# Patient Record
Sex: Male | Born: 2000 | Hispanic: No | Marital: Single | Smoking: Never smoker
Health system: Southern US, Community
[De-identification: ages and names within clinical notes are randomized; demographics above are authoritative.]

## PROBLEM LIST (undated history)

## (undated) DIAGNOSIS — F909 Attention-deficit hyperactivity disorder, unspecified type: Secondary | ICD-10-CM

## (undated) HISTORY — DX: Attention-deficit hyperactivity disorder, unspecified type: F90.9

---
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
## 2019-01-07 ENCOUNTER — Telehealth: Payer: Self-pay | Admitting: Family Medicine

## 2019-01-07 NOTE — Telephone Encounter (Signed)
Copied from [place] [id number]. Topic: Quick Communication - Rx Refill/Question >> Jan 07, 2019  1:36 PM Yvette Rack wrote: Pt mother stated the Rx for Vyvanse was sent to the wrong pharmacy and she was told it could not be transferred.  Medication: amphetamine-dextroamphetamine (ADDERALL) 10 MG tablet  and  lisdexamfetamine (VYVANSE) 40 MG capsule  Has the patient contacted their pharmacy? yes   Preferred Pharmacy (with phone number or street name): [id number] - [address], [place] [phone] (Phone)  [phone] (Fax)  Agent: Please be advised that RX refills may take up to 3 business days. We ask that you follow-up with your pharmacy.

## 2019-01-08 MED ORDER — AMPHETAMINE-DEXTROAMPHETAMINE 10 MG PO TABS: 10.0000 mg | ORAL_TABLET | Freq: Every day | ORAL | 0 refills | Status: DC | PRN

## 2019-01-08 NOTE — Telephone Encounter (Signed)
Ad[facility]all refilled to [facility], [facility] and cancel the vyvanse they have on file and find out if he's filled for this month yet. Once we get that sorted I will send over the remainder to bridge him the entire 3 months

## 2019-01-09 MED ORDER — LISDEXAMFETAMINE DIMESYLATE 40 MG PO CAPS: 40.0000 mg | ORAL_CAPSULE | ORAL | 0 refills | Status: DC

## 2019-01-09 NOTE — Telephone Encounter (Signed)
Patient notified

## 2019-01-09 NOTE — Telephone Encounter (Signed)
Called patient's mother. Left detailed VM  (DPR revived) and asked to return call with any questions.

## 2019-01-09 NOTE — Telephone Encounter (Signed)
Please let her know both the adderall refill and the remaining vyvanse (set for next month) have been transferred

## 2019-01-09 NOTE — Telephone Encounter (Signed)
Spoke with Pharmacy. Vyvanse last picked up on 01/07/19. Remaining RXs cancelled.

## 2019-01-09 NOTE — Telephone Encounter (Signed)
[facility]yvanse sent to [facility]

## 2019-03-02 ENCOUNTER — Telehealth: Payer: Self-pay | Admitting: Family Medicine

## 2019-03-02 MED ORDER — LISDEXAMFETAMINE DIMESYLATE 40 MG PO CAPS: 40.0000 mg | ORAL_CAPSULE | ORAL | 0 refills | Status: DC

## 2019-03-02 NOTE — Telephone Encounter (Signed)
Pt.notified

## 2019-03-02 NOTE — Telephone Encounter (Signed)
Routing to provider. Next appointment scheduled for 03/11/19.

## 2019-03-02 NOTE — Telephone Encounter (Signed)
Medication Refill - Medication: lisdexamfetamine (VYVANSE) 40 MG capsule   Please call Pt's mom if an appt is needed /she is not sure when he needs his next appt   Has the patient contacted their pharmacy? No. (Agent: If no, request that the patient contact the pharmacy for the refill.) (Agent: If yes, when and what did the pharmacy advise?)  Preferred Pharmacy (with phone number or street name):  [id number] [address], [place] - [place] [phone] (Phone) [phone] (Fax)     Agent: Please be advised that RX refills may take up to 3 business days. We ask that you follow-up with your pharmacy.

## 2019-03-02 NOTE — Telephone Encounter (Signed)
Looks like it was just scheduled a week or so past when he's due which is no problem. I will go ahead and get it sent over to drop on the day it will be due which is 03/06/19

## 2019-03-11 ENCOUNTER — Encounter: Payer: Self-pay | Admitting: Family Medicine

## 2019-03-11 ENCOUNTER — Other Ambulatory Visit: Payer: Self-pay

## 2019-03-11 ENCOUNTER — Ambulatory Visit (INDEPENDENT_AMBULATORY_CARE_PROVIDER_SITE_OTHER): Admitting: Family Medicine

## 2019-03-11 VITALS — Ht 70.0 in | Wt 145.0 lb

## 2019-03-11 DIAGNOSIS — F909 Attention-deficit hyperactivity disorder, unspecified type: Secondary | ICD-10-CM | POA: Diagnosis not present

## 2019-03-11 MED ORDER — LISDEXAMFETAMINE DIMESYLATE 50 MG PO CAPS: 50.0000 mg | ORAL_CAPSULE | Freq: Every day | ORAL | 0 refills | Status: DC

## 2019-03-11 NOTE — Progress Notes (Signed)
Ht 5\' 10"  (1.778 m)   Wt 145 lb (65.8 kg)   BMI 20.81 kg/m    Subjective:    Patient ID: Robert CoveyMatthew Bagshaw, male    DOB: January 18, 2001, 18 y.o.   MRN: [id number]  HPI: Robert Lane is a 18 y.o. male  Chief Complaint  Patient presents with  . ADHD  . Medication Management    . This visit was completed via WebEx due to the restrictions of the COVID-19 pandemic. All issues as above were discussed and addressed. Physical exam was done as above through visual confirmation on WebEx. If it was felt that the patient should be evaluated in the office, they were directed there. The patient verbally consented to this visit. . Location of the patient: home . Location of the provider: work . Those involved with this call:  . Provider: Roosvelt Maserachel Kalynn Declercq, PA-C . CMA: Wilhemena DurieBrittany Russell, CMA . Front Desk/Registration: Harriet PhoJoliza Johnson  . Time spent on call: 15 minutes with patient face to face via video conference. More than 50% of this time was spent in counseling and coordination of care. 5 minutes total spent in review of patient's record and preparation of their chart. I verified patient identity using two factors (patient name and date of birth). Patient consents verbally to being seen via telemedicine visit today.   Patient here today for ADHD f/u. Currently taking 40 mg vyvanse daily. Has additional 10 mg short release adderall available but states he's not really taking that. Feels his vyvanse works "ok", but wondering if an increased dose may give him more ability to complete and accomplish more than he is now. Denies any side effects to the medication. No CP, SOB, palpitations, sleep or appetite issues.   Relevant past medical, surgical, family and social history reviewed and updated as indicated. Interim medical history since our last visit reviewed. Allergies and medications reviewed and updated.  Review of Systems  Per HPI unless specifically indicated above     Objective:    Ht 5\' 10"  (1.778  m)   Wt 145 lb (65.8 kg)   BMI 20.81 kg/m   Wt Readings from Last 3 Encounters:  03/11/19 145 lb (65.8 kg) (38 %, Z= -0.30)*  10/29/18 145 lb 1 oz (65.8 kg) (41 %, Z= -0.23)*  10/26/18 145 lb (65.8 kg) (41 %, Z= -0.23)*   * Growth percentiles are based on CDC (Boys, 2-20 Years) data.    Physical Exam Vitals signs and nursing note reviewed.  Constitutional:      General: He is not in acute distress.    Appearance: Normal appearance.  HENT:     Head: Atraumatic.     Right Ear: External ear normal.     Left Ear: External ear normal.     Nose: Nose normal. No congestion.     Mouth/Throat:     Mouth: Mucous membranes are moist.     Pharynx: Oropharynx is clear.  Eyes:     Extraocular Movements: Extraocular movements intact.     Conjunctiva/sclera: Conjunctivae normal.  Neck:     Musculoskeletal: Normal range of motion.  Pulmonary:     Effort: Pulmonary effort is normal. No respiratory distress.  Musculoskeletal: Normal range of motion.  Skin:    General: Skin is dry.     Findings: No erythema or rash.  Neurological:     Mental Status: He is oriented to person, place, and time.  Psychiatric:        Mood and Affect: Mood normal.  Thought Content: Thought content normal.        Judgment: Judgment normal.     No results found for this or any previous visit.    Assessment & Plan:   Problem List Items Addressed This Visit      Other   ADHD - Primary    Will trial increased vyvanse at 50 mg daily prn. Patient states he's no longer taking the short release adderall prn but would like to keep on list in case needing it in the future. F/u in 1 month for recheck          Follow up plan: Return in about 4 weeks (around 04/08/2019) for ADHD f/u.

## 2019-03-16 NOTE — Assessment & Plan Note (Signed)
Will trial increased vyvanse at 50 mg daily prn. Patient states he's no longer taking the short release adderall prn but would like to keep on list in case needing it in the future. F/u in 1 month for recheck

## 2019-04-16 ENCOUNTER — Other Ambulatory Visit: Payer: Self-pay | Admitting: Family Medicine

## 2019-04-16 NOTE — Telephone Encounter (Signed)
Requested medication (s) are due for refill today: yes  Requested medication (s) are on the active medication list: yes  Last refill:  01/21/2019  Future visit scheduled: no  Notes to clinic:  Review for refill Ordering provider and pcp are different   Requested Prescriptions  Pending Prescriptions Disp Refills   cloNIDine (CATAPRES) 0.2 MG tablet [Pharmacy Med Name: CLONIDINE 0.2MG  TABLETS] 90 tablet 0    Sig: TAKE 1 TABLET BY MOUTH EVERY DAY     Cardiovascular:  Alpha-2 Agonists Passed - 04/16/2019 11:02 AM      Passed - Last BP in normal range    BP Readings from Last 1 Encounters:  11/02/18 99/64         Passed - Last Heart Rate in normal range    Pulse Readings from Last 1 Encounters:  11/02/18 70         Passed - Valid encounter within last 6 months    Recent Outpatient Visits          1 month ago Attention deficit hyperactivity disorder (ADHD), unspecified ADHD type   [facility] Volney American, PA-C   4 months ago Attention deficit hyperactivity disorder (ADHD), unspecified ADHD type   [facility] Volney American, [place]   8 months ago Attention deficit hyperactivity disorder (ADHD), unspecified ADHD type   [place], [place], [place]   11 months ago Attention deficit hyperactivity disorder (ADHD), unspecified ADHD type   [facility] Volney American, [place]   1 year ago Attention deficit hyperactivity disorder (ADHD), unspecified ADHD type   [place], [place], [place]

## 2019-05-08 ENCOUNTER — Other Ambulatory Visit: Payer: Self-pay

## 2019-05-08 MED ORDER — LISDEXAMFETAMINE DIMESYLATE 50 MG PO CAPS: 50.0000 mg | ORAL_CAPSULE | Freq: Every day | ORAL | 0 refills | Status: DC

## 2019-05-08 NOTE — Telephone Encounter (Signed)
Called and left patient's mother a detailed message (signed DPR) letting her know what Apolonio Schneiders said. Asked for them to please call back to schedule a f/up ASAP.

## 2019-05-08 NOTE — Telephone Encounter (Signed)
Copied from [place] [id number]. Topic: Quick Communication - See Telephone Encounter >> May 07, 2019  6:43 PM Loma Boston wrote: CRM for notification. See Telephone encounter for: 05/07/19. Please call pt mother Rip Harbour) about why the son's scripts are not being filled. She states she has been calling for a week. [phone] amphetamine-dextroamphetamine (ADDERALL) 10 MG tablet   lisdexamfetamine (VYVANSE) 50 MG capsule   Routing to provider. Patient was last seen 03/11/19.

## 2019-05-08 NOTE — Telephone Encounter (Signed)
His vyvanse was increased back in August during that visit and he was to f/u in 1 month for recheck. He also told me he keeps the adderall around but does not use it and did not need a refill. Do not have any record of recent refill requests for these medications so I'm not sure what she is referring to with that comment. He needs an appt for further refills as he is a month overdue for this. I will refill to bridge him with the vyvanse. Can discuss the adderall if he needs that at this OV

## 2019-05-20 ENCOUNTER — Ambulatory Visit (INDEPENDENT_AMBULATORY_CARE_PROVIDER_SITE_OTHER): Admitting: Family Medicine

## 2019-05-20 ENCOUNTER — Encounter: Payer: Self-pay | Admitting: Family Medicine

## 2019-05-20 ENCOUNTER — Other Ambulatory Visit: Payer: Self-pay

## 2019-05-20 DIAGNOSIS — F909 Attention-deficit hyperactivity disorder, unspecified type: Secondary | ICD-10-CM

## 2019-05-20 MED ORDER — LISDEXAMFETAMINE DIMESYLATE 40 MG PO CAPS: 40.0000 mg | ORAL_CAPSULE | ORAL | 0 refills | Status: DC

## 2019-05-20 NOTE — Assessment & Plan Note (Signed)
Will cut back to 40 mg and monitor for improvement. Also suggested going down to 30 mg vyvanse and trying strattera as the stimulant may be too strong for him at this time. Will check back in 1 month, he is agreeable to going back to previous dose. He does not take the adderall short release, has not since getting out of school this summer.

## 2019-05-20 NOTE — Progress Notes (Signed)
   There were no vitals taken for this visit.   Subjective:    Patient ID: Robert Lane, male    DOB: 2001/02/04, 18 y.o.   MRN: [id number]  HPI: Robert Lane is a 19 y.o. male  Chief Complaint  Patient presents with  . ADHD    . This visit was completed via telephone due to the restrictions of the COVID-19 pandemic. All issues as above were discussed and addressed. Physical exam was done as above through visual confirmation on telephone. If it was felt that the patient should be evaluated in the office, they were directed there. The patient verbally consented to this visit. . Location of the patient: home . Location of the provider: work . Those involved with this call:  . Provider: Merrie Roof, PA-C . CMA: Yvonna Alanis, [place] . Front Desk/Registration: Jill Side  . Time spent on call: 15 minutes on the phone discussing health concerns. 5 minutes total spent in review of patient's record and preparation of their chart. I verified patient identity using two factors (patient name and date of birth). Patient consents verbally to being seen via telemedicine visit today.   Following up on his ADHD. Feels the increase in his vyvanse to 50 mg works as far as focus and productivity but has been having blank thoughts, loss of creativity and doesn't like how his brain feels. Notes he will forget to eat sometimes nearly all day. Wanting to cut back down at least to his previous dose. Denies CP, palpitations, SOB, HAs, dizziness. Does not take the short release adderall.   Relevant past medical, surgical, family and social history reviewed and updated as indicated. Interim medical history since our last visit reviewed. Allergies and medications reviewed and updated.  Review of Systems  Per HPI unless specifically indicated above     Objective:    There were no vitals taken for this visit.  Wt Readings from Last 3 Encounters:  03/11/19 145 lb (65.8 kg) (38 %, Z= -0.30)*  10/29/18  145 lb 1 oz (65.8 kg) (41 %, Z= -0.23)*  10/26/18 145 lb (65.8 kg) (41 %, Z= -0.23)*   * Growth percentiles are based on CDC (Boys, 2-20 Years) data.    Physical Exam  Unable to perform PE due to patient lack of access to video technology for today's visit  No results found for this or any previous visit.    Assessment & Plan:   Problem List Items Addressed This Visit      Other   ADHD - Primary    Will cut back to 40 mg and monitor for improvement. Also suggested going down to 30 mg vyvanse and trying strattera as the stimulant may be too strong for him at this time. Will check back in 1 month, he is agreeable to going back to previous dose. He does not take the adderall short release, has not since getting out of school this summer.           Follow up plan: Return in about 4 weeks (around 06/17/2019) for ADHD f/u.

## 2019-06-17 ENCOUNTER — Other Ambulatory Visit: Payer: Self-pay | Admitting: Family Medicine

## 2019-06-17 ENCOUNTER — Telehealth: Payer: Self-pay | Admitting: Family Medicine

## 2019-06-17 NOTE — Telephone Encounter (Signed)
Medication Refill - Vyvance 40 mg Has the patient contacted their pharmacy? No. (Agent: If no, request that the patient contact the pharmacy for the refill.) (Agent: If yes, when and what did the pharmacy advise?)  Preferred Pharmacy (with phone number or street name): [facility] in [place]: Please be advised that RX refills may take up to 3 business days. We ask that you follow-up with your pharmacy.

## 2019-06-18 MED ORDER — LISDEXAMFETAMINE DIMESYLATE 40 MG PO CAPS: 40.0000 mg | ORAL_CAPSULE | ORAL | 0 refills | Status: DC

## 2019-06-18 NOTE — Telephone Encounter (Signed)
Routing to provider  

## 2019-07-08 ENCOUNTER — Other Ambulatory Visit: Payer: Self-pay

## 2019-07-08 ENCOUNTER — Encounter: Payer: Self-pay | Admitting: Family Medicine

## 2019-07-08 ENCOUNTER — Ambulatory Visit (INDEPENDENT_AMBULATORY_CARE_PROVIDER_SITE_OTHER): Admitting: Family Medicine

## 2019-07-08 VITALS — Ht 70.0 in | Wt 145.0 lb

## 2019-07-08 DIAGNOSIS — F909 Attention-deficit hyperactivity disorder, unspecified type: Secondary | ICD-10-CM | POA: Diagnosis not present

## 2019-07-08 DIAGNOSIS — G4700 Insomnia, unspecified: Secondary | ICD-10-CM

## 2019-07-08 MED ORDER — LISDEXAMFETAMINE DIMESYLATE 40 MG PO CAPS: 40.0000 mg | ORAL_CAPSULE | ORAL | 0 refills | Status: DC

## 2019-07-08 MED ORDER — CLONIDINE HCL 0.2 MG PO TABS: 0.2000 mg | ORAL_TABLET | Freq: Every day | ORAL | 1 refills | Status: DC

## 2019-07-08 NOTE — Assessment & Plan Note (Signed)
Improved on 40 mg vyvanse, will continue current regimen for now and revisit further decrease at upcoming f/u

## 2019-07-08 NOTE — Assessment & Plan Note (Signed)
Stable and under good control, continue current regimen 

## 2019-07-08 NOTE — Progress Notes (Signed)
Ht 5\' 10"  (1.778 m)   Wt 145 lb (65.8 kg)   BMI 20.81 kg/m    Subjective:    Patient ID: Robert Lane, male    DOB: 2000-09-19, 18 y.o.   MRN: [id number]  HPI: Robert Lane is a 18 y.o. male  Chief Complaint  Patient presents with  . ADHD    . This visit was completed via WebEx due to the restrictions of the COVID-19 pandemic. All issues as above were discussed and addressed. Physical exam was done as above through visual confirmation on WebEx. If it was felt that the patient should be evaluated in the office, they were directed there. The patient verbally consented to this visit. . Location of the patient: work . Location of the provider: work . Those involved with this call:  . Provider: Merrie Roof, PA-C . CMA: Lesle Chris, [place] . Front Desk/Registration: Jill Side  . Time spent on call: 15 minutes with patient face to face via video conference. More than 50% of this time was spent in counseling and coordination of care. 5 minutes total spent in review of patient's record and preparation of their chart. I verified patient identity using two factors (patient name and date of birth). Patient consents verbally to being seen via telemedicine visit today.   Here today for f/u ADHD after decreasing vyvanse dose. Feeling better on the 40 mg dose, has better appetite and does not feel like a zombie anymore. Able to get tasks done at work and stay on track. Denies CP, SOB, HAs, dizziness. No longer taking the short release adderall since being out of school but has it in case needed.   Still taking the clonidine for sleep nightly which continues to work well for him.   Relevant past medical, surgical, family and social history reviewed and updated as indicated. Interim medical history since our last visit reviewed. Allergies and medications reviewed and updated.  Review of Systems  Per HPI unless specifically indicated above     Objective:    Ht 5\' 10"  (1.778 m)   Wt 145  lb (65.8 kg)   BMI 20.81 kg/m   Wt Readings from Last 3 Encounters:  07/08/19 145 lb (65.8 kg) (36 %, Z= -0.35)*  03/11/19 145 lb (65.8 kg) (38 %, Z= -0.30)*  10/29/18 145 lb 1 oz (65.8 kg) (41 %, Z= -0.23)*   * Growth percentiles are based on CDC (Boys, 2-20 Years) data.    Physical Exam Vitals signs and nursing note reviewed.  Constitutional:      General: He is not in acute distress.    Appearance: Normal appearance.  HENT:     Head: Atraumatic.     Right Ear: External ear normal.     Left Ear: External ear normal.     Nose: Nose normal. No congestion.     Mouth/Throat:     Mouth: Mucous membranes are moist.     Pharynx: Oropharynx is clear.  Eyes:     Extraocular Movements: Extraocular movements intact.     Conjunctiva/sclera: Conjunctivae normal.  Neck:     Musculoskeletal: Normal range of motion.  Pulmonary:     Effort: Pulmonary effort is normal. No respiratory distress.  Musculoskeletal: Normal range of motion.  Skin:    General: Skin is dry.     Findings: No erythema or rash.  Neurological:     Mental Status: He is oriented to person, place, and time.  Psychiatric:  Mood and Affect: Mood normal.        Thought Content: Thought content normal.        Judgment: Judgment normal.     No results found for this or any previous visit.    Assessment & Plan:   Problem List Items Addressed This Visit      Other   ADHD    Improved on 40 mg vyvanse, will continue current regimen for now and revisit further decrease at upcoming f/u      Insomnia - Primary    Stable and under good control, continue current regimen          Follow up plan: Return in about 3 months (around 10/06/2019) for ADHD f/u.

## 2019-07-27 ENCOUNTER — Telehealth: Payer: Self-pay | Admitting: Family Medicine

## 2019-07-27 ENCOUNTER — Encounter: Payer: Self-pay | Admitting: Family Medicine

## 2019-07-27 NOTE — Telephone Encounter (Signed)
Called pt to schedule 3 month f/u per Apolonio Schneiders, no answer, left vm, sending letter.

## 2019-08-17 ENCOUNTER — Telehealth: Payer: Self-pay | Admitting: Family Medicine

## 2019-08-17 NOTE — Telephone Encounter (Signed)
Called pharmacy, they are getting medication ready for the patient.   Called mother and let her know that they could pick up RX shortly.

## 2019-08-17 NOTE — Telephone Encounter (Signed)
Pt's, mother is here (DPR) is here stating pt is out of medication of his ADHD medication and wants the medication sent to [facility] in [place] . 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

## 2019-08-17 NOTE — Telephone Encounter (Signed)
Can we call to get filled a day early? If not, please cancel the one for tomorrow and I can re-send to fill today

## 2019-09-02 DIAGNOSIS — Z7689 Persons encountering health services in other specified circumstances: Secondary | ICD-10-CM | POA: Diagnosis not present

## 2019-09-15 ENCOUNTER — Telehealth: Payer: Self-pay | Admitting: Family Medicine

## 2019-09-15 ENCOUNTER — Other Ambulatory Visit: Payer: Self-pay | Admitting: Family Medicine

## 2019-09-15 MED ORDER — LISDEXAMFETAMINE DIMESYLATE 40 MG PO CAPS: 40.0000 mg | ORAL_CAPSULE | ORAL | 0 refills | Status: DC

## 2019-09-15 NOTE — Telephone Encounter (Signed)
Please call pharmacy and cancel the vyvanse that is set for 2/19, I am re-sending one to be filled today  **I had done this several days ago and now see it never routed - not sure if patient was already contacted**

## 2019-09-17 NOTE — Telephone Encounter (Signed)
Prescription cancelled for 02/19. Tried calling patient, no answer. LVM to return phone call.

## 2019-09-18 NOTE — Telephone Encounter (Signed)
LVM for pt that Rx was sent to pharmacy.

## 2019-10-08 ENCOUNTER — Encounter: Payer: Self-pay | Admitting: Family Medicine

## 2019-10-08 ENCOUNTER — Ambulatory Visit: Admitting: Family Medicine

## 2019-10-08 ENCOUNTER — Telehealth (INDEPENDENT_AMBULATORY_CARE_PROVIDER_SITE_OTHER): Admitting: Family Medicine

## 2019-10-08 VITALS — Ht 70.0 in | Wt 145.0 lb

## 2019-10-08 DIAGNOSIS — G4700 Insomnia, unspecified: Secondary | ICD-10-CM

## 2019-10-08 DIAGNOSIS — F909 Attention-deficit hyperactivity disorder, unspecified type: Secondary | ICD-10-CM | POA: Diagnosis not present

## 2019-10-08 NOTE — Progress Notes (Signed)
   Ht 5\' 10"  (1.778 m)   Wt 145 lb (65.8 kg)   BMI 20.81 kg/m    Subjective:    Patient ID: Robert Lane, male    DOB: 2000-09-10, 19 y.o.   MRN: [id number]  HPI: Robert Lane is a 19 y.o. male  Chief Complaint  Patient presents with  . ADHD    . This visit was completed via telephone due to the restrictions of the COVID-19 pandemic. All issues as above were discussed and addressed. Physical exam was done as above through visual confirmation on telephone. If it was felt that the patient should be evaluated in the office, they were directed there. The patient verbally consented to this visit. . Location of the patient: home . Location of the provider: work . Those involved with this call:  . Provider: 12, PA-C . CMA: Roosvelt Maser, CMA . Front Desk/Registration: Elton Sin  . Time spent on call: 15 minutes on the phone discussing health concerns. 5 minutes total spent in review of patient's record and preparation of their chart. I verified patient identity using two factors (patient name and date of birth). Patient consents verbally to being seen via telemedicine visit today.   Presenting today for f/u ADHD and insomnia. Feeling well on current regimen. Taking 40 mg vyvanse with good control. Denies side effects. Sleeping well with clonidine at bedtime. No new concerns.   Relevant past medical, surgical, family and social history reviewed and updated as indicated. Interim medical history since our last visit reviewed. Allergies and medications reviewed and updated.  Review of Systems  Per HPI unless specifically indicated above     Objective:    Ht 5\' 10"  (1.778 m)   Wt 145 lb (65.8 kg)   BMI 20.81 kg/m   Wt Readings from Last 3 Encounters:  10/08/19 145 lb (65.8 kg) (35 %, Z= -0.39)*  07/08/19 145 lb (65.8 kg) (36 %, Z= -0.35)*  03/11/19 145 lb (65.8 kg) (38 %, Z= -0.30)*   * Growth percentiles are based on CDC (Boys, 2-20 Years) data.    Physical  Exam  Unable to perform PE due to technical difficulties with video technology  No results found for this or any previous visit.    Assessment & Plan:   Problem List Items Addressed This Visit      Other   ADHD - Primary    Stable and well controlled. Not taking the short release adderall anymore since he's graduated from high school. Continue vyvanse regimen      Insomnia    Stable and well controlled, continue current regimen          Follow up plan: Return in about 3 months (around 01/08/2020) for ADHD f/u.

## 2019-10-13 ENCOUNTER — Encounter: Payer: Self-pay | Admitting: Family Medicine

## 2019-10-13 ENCOUNTER — Telehealth: Payer: Self-pay | Admitting: Family Medicine

## 2019-10-13 MED ORDER — LISDEXAMFETAMINE DIMESYLATE 40 MG PO CAPS: 40.0000 mg | ORAL_CAPSULE | ORAL | 0 refills | Status: DC

## 2019-10-13 MED ORDER — CLONIDINE HCL 0.2 MG PO TABS: 0.2000 mg | ORAL_TABLET | Freq: Every day | ORAL | 1 refills | Status: DC

## 2019-10-13 NOTE — Telephone Encounter (Signed)
-----   Message from Particia Nearing, [place] sent at 10/13/2019  8:01 AM EDT ----- 3 month ADHD f/u

## 2019-10-13 NOTE — Assessment & Plan Note (Signed)
Stable and well controlled, continue current regimen 

## 2019-10-13 NOTE — Assessment & Plan Note (Signed)
Stable and well controlled. Not taking the short release adderall anymore since he's graduated from high school. Continue vyvanse regimen

## 2019-10-13 NOTE — Telephone Encounter (Signed)
lvm to make this 3 month f/u, sent letter

## 2020-01-14 ENCOUNTER — Ambulatory Visit: Admitting: Family Medicine

## 2020-01-21 ENCOUNTER — Telehealth (INDEPENDENT_AMBULATORY_CARE_PROVIDER_SITE_OTHER): Admitting: Family Medicine

## 2020-01-21 ENCOUNTER — Encounter: Payer: Self-pay | Admitting: Family Medicine

## 2020-01-21 DIAGNOSIS — F909 Attention-deficit hyperactivity disorder, unspecified type: Secondary | ICD-10-CM

## 2020-01-21 MED ORDER — LISDEXAMFETAMINE DIMESYLATE 40 MG PO CAPS: 40.0000 mg | ORAL_CAPSULE | ORAL | 0 refills | Status: DC

## 2020-01-21 NOTE — Progress Notes (Signed)
There were no vitals taken for this visit.   Subjective:    Patient ID: Robert Lane, male    DOB: [birth year], 19 y.o.   MRN: [id number]  HPI: Robert Lane is a 19 y.o. male  Chief Complaint  Patient presents with  . ADHD    . This visit was completed via MyChart due to the restrictions of the COVID-19 pandemic. All issues as above were discussed and addressed. Physical exam was done as above through visual confirmation on MyChart. If it was felt that the patient should be evaluated in the office, they were directed there. The patient verbally consented to this visit. . Location of the patient: home . Location of the provider: work . Those involved with this call:  . Provider: Merrie Roof, PA-C . CMA: Lesle Chris, [place] . Front Desk/Registration: Don Perking  . Time spent on call: 10 minutes with patient face to face via video conference. More than 50% of this time was spent in counseling and coordination of care. 5 minutes total spent in review of patient's record and preparation of their chart. I verified patient identity using two factors (patient name and date of birth). Patient consents verbally to being seen via telemedicine visit today.   Here today for 3 month ADHD f/u. Taking 40 mg vyvanse daily which seems to be helping quite a bit with his focus and organization. Denies side effects including CP, SOB, palpitations, sleep or appetite concerns.   Relevant past medical, surgical, family and social history reviewed and updated as indicated. Interim medical history since our last visit reviewed. Allergies and medications reviewed and updated.  Review of Systems  Per HPI unless specifically indicated above     Objective:    There were no vitals taken for this visit.  Wt Readings from Last 3 Encounters:  10/08/19 145 lb (65.8 kg) (35 %, Z= -0.39)*  07/08/19 145 lb (65.8 kg) (36 %, Z= -0.35)*  03/11/19 145 lb (65.8 kg) (38 %, Z= -0.30)*   * Growth percentiles  are based on CDC (Boys, 2-20 Years) data.    Physical Exam Vitals and nursing note reviewed.  Constitutional:      General: He is not in acute distress.    Appearance: Normal appearance.  HENT:     Head: Atraumatic.     Right Ear: External ear normal.     Left Ear: External ear normal.     Nose: Nose normal. No congestion.     Mouth/Throat:     Mouth: Mucous membranes are moist.     Pharynx: Oropharynx is clear.  Eyes:     Extraocular Movements: Extraocular movements intact.     Conjunctiva/sclera: Conjunctivae normal.  Cardiovascular:     Rate and Rhythm: Normal rate and regular rhythm.  Pulmonary:     Effort: Pulmonary effort is normal. No respiratory distress.  Musculoskeletal:        General: Normal range of motion.     Cervical back: Normal range of motion.  Skin:    General: Skin is dry.     Findings: No erythema or rash.  Neurological:     Mental Status: He is oriented to person, place, and time.  Psychiatric:        Mood and Affect: Mood normal.        Thought Content: Thought content normal.        Judgment: Judgment normal.     No results found for this or any previous visit.  Assessment & Plan:   Problem List Items Addressed This Visit      Other   ADHD - Primary    Stable and well controlled, continue current regimen          Follow up plan: Return in about 3 months (around 04/22/2020) for ADHD.

## 2020-01-24 NOTE — Assessment & Plan Note (Signed)
Stable and well controlled, continue current regimen 

## 2020-01-25 ENCOUNTER — Telehealth: Payer: Self-pay | Admitting: Family Medicine

## 2020-01-25 NOTE — Telephone Encounter (Signed)
Unable to lvm for covid screening

## 2020-01-25 NOTE — Telephone Encounter (Signed)
Error unable to lvm to make this apt.

## 2020-01-25 NOTE — Telephone Encounter (Signed)
-----   Message from Particia Nearing, [place] sent at 01/24/2020  7:40 AM EDT ----- 3 month ADHD

## 2020-01-28 NOTE — Telephone Encounter (Signed)
Lvm to make this apt. 

## 2020-02-02 ENCOUNTER — Encounter: Payer: Self-pay | Admitting: Family Medicine

## 2020-02-02 NOTE — Telephone Encounter (Signed)
Unable to lvm to make this apt. Sent letter.  °

## 2020-04-24 ENCOUNTER — Other Ambulatory Visit: Payer: Self-pay

## 2020-04-24 ENCOUNTER — Ambulatory Visit
Admission: EM | Admit: 2020-04-24 | Discharge: 2020-04-24 | Disposition: A | Discharge status: Home / Self Care | Payer: Worker's Compensation | Attending: Emergency Medicine | Admitting: Emergency Medicine

## 2020-04-24 DIAGNOSIS — S91331A Puncture wound without foreign body, right foot, initial encounter: Secondary | ICD-10-CM | POA: Diagnosis not present

## 2020-04-24 DIAGNOSIS — Z23 Encounter for immunization: Secondary | ICD-10-CM

## 2020-04-24 MED ORDER — TETANUS-DIPHTH-ACELL PERTUSSIS 5-2.5-18.5 LF-MCG/0.5 IM SUSP
0.5000 mL | Freq: Once | INTRAMUSCULAR | Status: AC
  Administered 2020-04-24: 0.5 mL via INTRAMUSCULAR

## 2020-04-24 MED ORDER — CIPROFLOXACIN HCL 500 MG PO TABS: 500.0000 mg | ORAL_TABLET | Freq: Two times a day (BID) | ORAL | 0 refills | Status: AC

## 2020-04-24 NOTE — ED Provider Notes (Signed)
[facility]    CSN: [id number] Arrival date & time: 04/24/20  1355      History   Chief Complaint Chief Complaint  Patient presents with   Puncture Wound    HPI Robert Lane is a 19 y.o. male.   19 yo male here for evaluation of a puncture wound to the bottom of his foot from a nail in a pallet at the [facility] distribution center.   He denies numbness or tingling. He was wearing tennis shoes.  Tetanus updated at triage.      Past Medical History:  Diagnosis Date   ADHD     Patient Active Problem List   Diagnosis Date Noted   Insomnia 05/13/2018   ADHD 04/19/2017    History reviewed. No pertinent surgical history.     Home Medications    Prior to Admission medications   Medication Sig Start Date End Date Taking? Authorizing Provider  cloNIDine (CATAPRES) 0.2 MG tablet Take 1 tablet (0.2 mg total) by mouth daily. 10/13/19  Yes Particia Nearing, PA-C  lisdexamfetamine (VYVANSE) 40 MG capsule Take 1 capsule (40 mg total) by mouth every morning. 01/21/20  Yes Particia Nearing, PA-C  lisdexamfetamine (VYVANSE) 40 MG capsule Take 1 capsule (40 mg total) by mouth every morning. 02/20/20  Yes Particia Nearing, PA-C  lisdexamfetamine (VYVANSE) 40 MG capsule Take 1 capsule (40 mg total) by mouth every morning. 03/21/20  Yes Particia Nearing, PA-C  ciprofloxacin (CIPRO) 500 MG tablet Take 1 tablet (500 mg total) by mouth every 12 (twelve) hours for 10 days. 04/24/20 05/04/20  Becky Augusta, NP    Family History Family History  Problem Relation Age of Onset   Diabetes Mother    Hypertension Maternal Grandmother    Heart disease Maternal Grandfather    Hypertension Maternal Grandfather    Cancer Neg Hx     Social History Social History   Tobacco Use   Smoking status: Never Smoker   Smokeless tobacco: Never Used  Building services engineer Use: Never used  Substance Use Topics   Alcohol use: No   Drug use: No      Allergies   Patient has no known allergies.   Review of Systems Review of Systems  Constitutional: Negative for activity change and fever.  HENT: Negative for congestion and rhinorrhea.   Respiratory: Negative for cough and shortness of breath.   Cardiovascular: Negative for chest pain.  Gastrointestinal: Negative for diarrhea, nausea and vomiting.  Musculoskeletal: Positive for myalgias. Negative for arthralgias.  Skin: Positive for wound.       Puncture wound in right foot.   Neurological: Negative for weakness, numbness and headaches.  Hematological: Negative.   Psychiatric/Behavioral: Negative.      Physical Exam Triage Vital Signs ED Triage Vitals  Enc Vitals Group     BP 04/24/20 1525 108/68     Pulse Rate 04/24/20 1525 76     Resp 04/24/20 1525 18     Temp 04/24/20 1525 97.8 F (36.6 C)     Temp Source 04/24/20 1525 Oral     SpO2 04/24/20 1525 100 %     Weight 04/24/20 1524 145 lb (65.8 kg)     Height 04/24/20 1524 5' 9.5" (1.765 m)     Head Circumference --      Peak Flow --      Pain Score 04/24/20 1524 0     Pain Loc --      Pain Edu? --  Excl. in GC? --    No data found.  Updated Vital Signs BP 108/68 (BP Location: Right Arm)    Pulse 76    Temp 97.8 F (36.6 C) (Oral)    Resp 18    Ht 5' 9.5" (1.765 m)    Wt 145 lb (65.8 kg)    SpO2 100%    BMI 21.11 kg/m   Visual Acuity Right Eye Distance:   Left Eye Distance:   Bilateral Distance:    Right Eye Near:   Left Eye Near:    Bilateral Near:     Physical Exam Vitals and nursing note reviewed.  Constitutional:      Appearance: Normal appearance.  HENT:     Head: Normocephalic and atraumatic.     Nose: Nose normal.  Eyes:     Extraocular Movements: Extraocular movements intact.     Conjunctiva/sclera: Conjunctivae normal.     Pupils: Pupils are equal, round, and reactive to light.  Cardiovascular:     Rate and Rhythm: Normal rate and regular rhythm.     Pulses: Normal pulses.           Dorsalis pedis pulses are 2+ on the right side.       Posterior tibial pulses are 2+ on the right side.     Heart sounds: Normal heart sounds.  Pulmonary:     Effort: Pulmonary effort is normal.     Breath sounds: Normal breath sounds.  Musculoskeletal:        General: Signs of injury present. Normal range of motion.     Cervical back: Normal range of motion.     Right foot: Normal range of motion. No deformity, foot drop or prominent metatarsal heads.       Feet:  Feet:     Right foot:     Skin integrity: No erythema or callus.     Comments: There is a single puncture wound to the sole of the right foot in the pad behind the 5th toe. There is no bleeding and no ecchymosis. No tenderness to palpation.  Patient soaked his foot in water and chlorhexidine.  Skin:    General: Skin is warm and dry.     Capillary Refill: Capillary refill takes less than 2 seconds.  Neurological:     General: No focal deficit present.     Mental Status: He is alert and oriented to person, place, and time.  Psychiatric:        Mood and Affect: Mood normal.        Behavior: Behavior normal.        Thought Content: Thought content normal.        Judgment: Judgment normal.      UC Treatments / Results  Labs (all labs ordered are listed, but only abnormal results are displayed) Labs Reviewed - No data to display  EKG   Radiology No results found.  Procedures Procedures (including critical care time)  Medications Ordered in UC Medications  Tdap (BOOSTRIX) injection 0.5 mL (0.5 mLs Intramuscular Given 04/24/20 1526)    Initial Impression / Assessment and Plan / UC Course  I have reviewed the triage vital signs and the nursing notes.  Pertinent labs & imaging results that were available during my care of the patient were reviewed by me and considered in my medical decision making (see chart for details).   Patient is here for evaluation of a puncture wound in his right foot after stepping  on a  nail in a pallet at the [employer] distribution center. HE was wearing tennis shoes and it went through the sole.   The wound is clean and there is no redness, ecchymosis, bleeding, or drainage from the wound. No numbness or tingling and patient has full ROM.  Tetanus has been updated. Will cover with Cipro due to nail penetrating dirty sneakers to prevent infection, have patient keep his foot elevated as much as possible, and avoid wearing shoes for the next week.   Final Clinical Impressions(s) / UC Diagnoses   Final diagnoses:  Puncture wound of right foot, initial encounter     Discharge Instructions     Keep the wound clean and dry.  If you can avoid wearing shoes for the next week please do so.  Keep your right foot elevated as much as possible.  Take the Cipro twice daily for 10 days to prevent infection.  Return for redness, pain, swelling, red streaks going up your foot, drainage, or fever.     ED Prescriptions    Medication Sig Dispense Auth. Provider   ciprofloxacin (CIPRO) 500 MG tablet Take 1 tablet (500 mg total) by mouth every 12 (twelve) hours for 10 days. 20 tablet Becky Augusta, NP     PDMP not reviewed this encounter.   Becky Augusta, NP 04/24/20 1623

## 2020-04-24 NOTE — Discharge Instructions (Signed)
Keep the wound clean and dry.  If you can avoid wearing shoes for the next week please do so.  Keep your right foot elevated as much as possible.  Take the Cipro twice daily for 10 days to prevent infection.  Return for redness, pain, swelling, red streaks going up your foot, drainage, or fever.

## 2020-04-24 NOTE — ED Triage Notes (Signed)
Patient states that he stepped on a nail that was on a pallet and the nail went through his shoe in to his right foot. Patient states that this occurred at work and he works at the [employer] center.

## 2020-04-25 ENCOUNTER — Telehealth: Payer: Self-pay | Admitting: Family Medicine

## 2020-04-25 NOTE — Telephone Encounter (Signed)
Yes, let me know when he's scheduled for and I'll get him enough to make it to his appt

## 2020-04-25 NOTE — Telephone Encounter (Signed)
Can a short supply of medication be sent in to get him to next appointment? Will route back to admin for appointment if this can be done.

## 2020-04-25 NOTE — Telephone Encounter (Signed)
Copied from CRM [id number]. Topic: General - Inquiry >> Apr 25, 2020  4:09 PM Robert Lane wrote: Reason for CRM: Patient called and needs a medication refill on Vyvanse and was told he needed a visit with a new provider since his previous provider is no longer there. The only appointment available was on Thursday but he wanted something tomorrow because he will be completely out of medication, he took his last pill today. He is requesting a call back at [phone]. Please advise

## 2020-04-26 MED ORDER — LISDEXAMFETAMINE DIMESYLATE 40 MG PO CAPS: 40.0000 mg | ORAL_CAPSULE | ORAL | 0 refills | Status: DC

## 2020-04-26 NOTE — Telephone Encounter (Signed)
Called pt schedule he is scheduled for 10/21

## 2020-05-19 ENCOUNTER — Telehealth (INDEPENDENT_AMBULATORY_CARE_PROVIDER_SITE_OTHER): Admitting: Family Medicine

## 2020-05-19 ENCOUNTER — Encounter: Payer: Self-pay | Admitting: Family Medicine

## 2020-05-19 DIAGNOSIS — F909 Attention-deficit hyperactivity disorder, unspecified type: Secondary | ICD-10-CM

## 2020-05-19 MED ORDER — CLONIDINE HCL 0.2 MG PO TABS
0.2000 mg | ORAL_TABLET | Freq: Every day | ORAL | 1 refills | Status: DC
Start: 2020-05-19 — End: 2021-02-22

## 2020-05-19 MED ORDER — LISDEXAMFETAMINE DIMESYLATE 40 MG PO CAPS: 40.0000 mg | ORAL_CAPSULE | ORAL | 0 refills | Status: DC

## 2020-05-19 NOTE — Assessment & Plan Note (Signed)
Under good control on current regimen. Continue current regimen. Continue to monitor. Call with any concerns. Refills given for 3 months. Follow up 3 months.    

## 2020-05-19 NOTE — Progress Notes (Signed)
Ht 5' 9.5" (1.765 m)   Wt 150 lb (68 kg)   BMI 21.83 kg/m    Subjective:    Patient ID: Robert Lane, male    DOB: 27-Jun-2001, 19 y.o.   MRN: [id number]  HPI: Robert Lane is a 19 y.o. male  Chief Complaint  Patient presents with  . ADHD   ADHD FOLLOW UP ADHD status: controlled Satisfied with current therapy: yes Medication compliance:  excellent compliance Controlled substance contract: yes Previous psychiatry evaluation: no Previous medications: yes  Taking meds on weekends/vacations: yes Work/school performance:  good Difficulty sustaining attention/completing tasks: yes Distracted by extraneous stimuli: no Does not listen when spoken to: no  Fidgets with hands or feet: no Unable to stay in seat: no Blurts out/interrupts others: no ADHD Medication Side Effects: no    Decreased appetite: no    Headache: no    Sleeping disturbance pattern: no    Irritability: no    Rebound effects (worse than baseline) off medication: no    Anxiousness: no    Dizziness: no    Tics: no  Relevant past medical, surgical, family and social history reviewed and updated as indicated. Interim medical history since our last visit reviewed. Allergies and medications reviewed and updated.  Review of Systems  Constitutional: Negative.   Respiratory: Negative.   Cardiovascular: Negative.   Gastrointestinal: Negative.   Musculoskeletal: Negative.   Neurological: Negative.   Psychiatric/Behavioral: Negative.     Per HPI unless specifically indicated above     Objective:    Ht 5' 9.5" (1.765 m)   Wt 150 lb (68 kg)   BMI 21.83 kg/m   Wt Readings from Last 3 Encounters:  05/19/20 150 lb (68 kg)  04/24/20 145 lb (65.8 kg)  10/08/19 145 lb (65.8 kg) (35 %, Z= -0.39)*   * Growth percentiles are based on CDC (Boys, 2-20 Years) data.    Physical Exam Constitutional:      General: He is not in acute distress.    Appearance: Normal appearance. He is well-developed and normal  weight. He is not ill-appearing, toxic-appearing or diaphoretic.  HENT:     Head: Normocephalic and atraumatic.     Right Ear: Hearing and external ear normal.     Left Ear: Hearing and external ear normal.     Nose: Nose normal.  Eyes:     General: Lids are normal. No scleral icterus.       Right eye: No discharge.        Left eye: No discharge.     Extraocular Movements: Extraocular movements intact.     Conjunctiva/sclera: Conjunctivae normal.     Pupils: Pupils are equal, round, and reactive to light.  Pulmonary:     Effort: Pulmonary effort is normal. No respiratory distress.  Musculoskeletal:        General: Normal range of motion.     Cervical back: Normal range of motion.  Skin:    Coloration: Skin is not jaundiced or pale.     Findings: No bruising, erythema, lesion or rash.  Neurological:     General: No focal deficit present.     Mental Status: He is alert and oriented to person, place, and time.  Psychiatric:        Mood and Affect: Mood normal.        Speech: Speech normal.        Behavior: Behavior normal.        Thought Content: Thought content normal.  Judgment: Judgment normal.     No results found for this or any previous visit.    Assessment & Plan:   Problem List Items Addressed This Visit      Other   ADHD    Under good control on current regimen. Continue current regimen. Continue to monitor. Call with any concerns. Refills given for 3 months. Follow up 3 months.            Follow up plan: Return for Before 08/17/20 for physical/add follow up.   . This visit was completed via MyChart due to the restrictions of the COVID-19 pandemic. All issues as above were discussed and addressed. Physical exam was done as above through visual confirmation on MyChart. If it was felt that the patient should be evaluated in the office, they were directed there. The patient verbally consented to this visit. . Location of the patient: home . Location of  the provider: work . Those involved with this call:  . Provider: Olevia Perches, DO . CMA: Rondel Baton, CMA . Front Desk/Registration: Harriet Pho  . Time spent on call: 15 minutes with patient face to face via video conference. More than 50% of this time was spent in counseling and coordination of care. 23 minutes total spent in review of patient's record and preparation of their chart.

## 2020-05-24 ENCOUNTER — Telehealth: Payer: Self-pay

## 2020-05-24 NOTE — Telephone Encounter (Signed)
-----   Message from Dorcas Carrow, [place] sent at 05/19/2020  2:41 PM EDT ----- Before 08/17/20 for physical/add follow up

## 2020-08-25 ENCOUNTER — Telehealth: Payer: Self-pay

## 2020-08-25 NOTE — Telephone Encounter (Signed)
Scheduled virtual tomorrow can we change his pharmacy to [facility] in [place]  Copied from CRM [id number]. Topic: Quick Communication - See Telephone Encounter >> Aug 25, 2020  4:12 PM Aretta Nip wrote: CRM for notification. See Telephone encounter for: 08/25/20. Pt mother is wanting a cb  [phone] see if an appt can be made for son as out of meds and must have for work.  Only has 1 left. He wanted a virtual appt  [phone] just for the med refill lisdexamfetamine (VYVANSE) 40 MG capsule Medication Expired Date: 05/19/2020 Department: [facility] Ordering/Authorizing: Dorcas Carrow, DO

## 2020-08-26 ENCOUNTER — Telehealth (INDEPENDENT_AMBULATORY_CARE_PROVIDER_SITE_OTHER): Admitting: Nurse Practitioner

## 2020-08-26 ENCOUNTER — Other Ambulatory Visit: Payer: Self-pay

## 2020-08-26 ENCOUNTER — Encounter: Payer: Self-pay | Admitting: Nurse Practitioner

## 2020-08-26 DIAGNOSIS — F909 Attention-deficit hyperactivity disorder, unspecified type: Secondary | ICD-10-CM | POA: Diagnosis not present

## 2020-08-26 MED ORDER — LISDEXAMFETAMINE DIMESYLATE 40 MG PO CAPS: 40.0000 mg | ORAL_CAPSULE | ORAL | 0 refills | Status: DC

## 2020-08-26 NOTE — Progress Notes (Signed)
There were no vitals taken for this visit.   Subjective:    Patient ID: Robert Lane, male    DOB: 03-Jun-2001, 20 y.o.   MRN: [id number]  HPI: Robert Lane is a 20 y.o. male  Chief Complaint  Patient presents with  . Medication Refill    . This visit was completed via telephone due to the restrictions of the COVID-19 pandemic. All issues as above were discussed and addressed but no physical exam was performed. If it was felt that the patient should be evaluated in the office, they were directed there. The patient verbally consented to this visit. Patient was unable to complete an audio/visual visit due to Technical difficulties, Lack of internet. Due to the catastrophic nature of the COVID-19 pandemic, this visit was done through audio contact only. . Location of the patient: home . Location of the provider: work . Those involved with this call:  . Provider: Aura Dials, DNP . CMA: Tristan Schroeder, CMA . Front Desk/Registration: Harriet Pho  . Time spent on call: 21 minutes on the phone discussing health concerns. 15 minutes total spent in review of patient's record and preparation of their chart.  . I verified patient identity using two factors (patient name and date of birth). Patient consents verbally to being seen via telemedicine visit today.   ADHD FOLLOW UP Presents for refill today on Vyvanse.   No current acute issues. ADHD status: stable Satisfied with current therapy: yes Medication compliance:  good compliance Controlled substance contract: no Previous psychiatry evaluation: no Previous medications: Vyvanse Taking meds on weekends/vacations: yes Work/school performance:  good Difficulty sustaining attention/completing tasks: no Distracted by extraneous stimuli: no Does not listen when spoken to: no  Fidgets with hands or feet: occasional Unable to stay in seat: no Blurts out/interrupts others: yes ADHD Medication Side Effects: no    Decreased appetite:  no    Headache: no    Sleeping disturbance pattern: no    Irritability: no    Rebound effects (worse than baseline) off medication: no    Anxiousness: no    Dizziness: no    Tics: no  Relevant past medical, surgical, family and social history reviewed and updated as indicated. Interim medical history since our last visit reviewed. Allergies and medications reviewed and updated.  Review of Systems  Constitutional: Negative for activity change, diaphoresis, fatigue and fever.  Respiratory: Negative for cough, chest tightness, shortness of breath and wheezing.   Cardiovascular: Negative for chest pain, palpitations and leg swelling.  Psychiatric/Behavioral: Negative.     Per HPI unless specifically indicated above     Objective:    There were no vitals taken for this visit.  Wt Readings from Last 3 Encounters:  05/19/20 150 lb (68 kg)  04/24/20 145 lb (65.8 kg)  10/08/19 145 lb (65.8 kg) (35 %, Z= -0.39)*   * Growth percentiles are based on CDC (Boys, 2-20 Years) data.    Physical Exam   Unable to perform due to telephone visit only.  No results found for this or any previous visit.    Assessment & Plan:   Problem List Items Addressed This Visit      Other   ADHD    Chronic, stable with current medication.  Continue current medication regimen and adjust as needed.  Refills sent in and PDMP reviewed.  Next visit obtain UDS and controlled substance contract.  Return in 3 months.          Follow up plan: Return  in about 3 months (around 11/24/2020) for ADHD -- need UDS and controlled substance contract.

## 2020-08-26 NOTE — Patient Instructions (Signed)

## 2020-08-26 NOTE — Assessment & Plan Note (Signed)
Chronic, stable with current medication.  Continue current medication regimen and adjust as needed.  Refills sent in and PDMP reviewed.  Next visit obtain UDS and controlled substance contract.  Return in 3 months.

## 2020-08-30 ENCOUNTER — Telehealth: Payer: Self-pay

## 2020-08-30 NOTE — Telephone Encounter (Signed)
lvm to make this apt.  

## 2020-08-30 NOTE — Telephone Encounter (Signed)
-----   Message from Marjie Skiff, NP sent at 08/26/2020 10:20 AM EST ----- Need follow-up in 3 months

## 2020-08-31 NOTE — Telephone Encounter (Signed)
Lvm to get this scheduled.

## 2020-09-01 ENCOUNTER — Encounter: Payer: Self-pay | Admitting: Nurse Practitioner

## 2020-09-01 NOTE — Telephone Encounter (Signed)
Lvm to get this scheduled. Sent letter.

## 2020-11-23 ENCOUNTER — Ambulatory Visit (INDEPENDENT_AMBULATORY_CARE_PROVIDER_SITE_OTHER): Admitting: Nurse Practitioner

## 2020-11-23 ENCOUNTER — Other Ambulatory Visit: Payer: Self-pay

## 2020-11-23 ENCOUNTER — Encounter: Payer: Self-pay | Admitting: Nurse Practitioner

## 2020-11-23 DIAGNOSIS — F909 Attention-deficit hyperactivity disorder, unspecified type: Secondary | ICD-10-CM | POA: Diagnosis not present

## 2020-11-23 MED ORDER — LISDEXAMFETAMINE DIMESYLATE 30 MG PO CAPS: 30.0000 mg | ORAL_CAPSULE | Freq: Every day | ORAL | 0 refills | Status: DC

## 2020-11-23 NOTE — Progress Notes (Signed)
There were no vitals taken for this visit.   Subjective:    Patient ID: Robert Lane, male    DOB: 12/26/2000, 20 y.o.   MRN: [id number]  HPI: Robert Lane is a 20 y.o. male  Chief Complaint  Patient presents with  . Medication Refill    Patient states he has 2 pills of his prescription left and does not want to run out before the weekend.    . This visit was completed via telephone due to the restrictions of the COVID-19 pandemic. All issues as above were discussed and addressed but no physical exam was performed. If it was felt that the patient should be evaluated in the office, they were directed there. The patient verbally consented to this visit. Patient was unable to complete an audio/visual visit due to Technical difficulties, Lack of internet. Due to the catastrophic nature of the COVID-19 pandemic, this visit was done through audio contact only. . Location of the patient: home . Location of the provider: work . Those involved with this call:  . Provider: Aura Dials, DNP . CMA: Malen Gauze CMA . Front Desk/Registration: Harriet Pho  . Time spent on call: 21 minutes on the phone discussing health concerns. 15 minutes total spent in review of patient's record and preparation of their chart.  . I verified patient identity using two factors (patient name and date of birth). Patient consents verbally to being seen via telemedicine visit today.   ADHD FOLLOW UP Presents for refill today on Vyvanse.   He would like to try going down on dose, as he feels current dose is too much -- feeling emotionless.  PDMP review, last fill 10/25/20. ADHD status: stable Satisfied with current therapy: yes Medication compliance:  good compliance Controlled substance contract: no Previous psychiatry evaluation: no Previous medications: Vyvanse Taking meds on weekends/vacations: yes Work/school performance:  good Difficulty sustaining attention/completing tasks: no Distracted by  extraneous stimuli: no Does not listen when spoken to: no  Fidgets with hands or feet: occasional Unable to stay in seat: no Blurts out/interrupts others: yes ADHD Medication Side Effects: no    Decreased appetite: no    Headache: no    Sleeping disturbance pattern: no    Irritability: no    Rebound effects (worse than baseline) off medication: no    Anxiousness: no    Dizziness: no    Tics: no  Relevant past medical, surgical, family and social history reviewed and updated as indicated. Interim medical history since our last visit reviewed. Allergies and medications reviewed and updated.  Review of Systems  Constitutional: Negative for activity change, diaphoresis, fatigue and fever.  Respiratory: Negative for cough, chest tightness, shortness of breath and wheezing.   Cardiovascular: Negative for chest pain, palpitations and leg swelling.  Psychiatric/Behavioral: Negative.     Per HPI unless specifically indicated above     Objective:    There were no vitals taken for this visit.  Wt Readings from Last 3 Encounters:  05/19/20 150 lb (68 kg)  04/24/20 145 lb (65.8 kg)  10/08/19 145 lb (65.8 kg) (35 %, Z= -0.39)*   * Growth percentiles are based on CDC (Boys, 2-20 Years) data.    Physical Exam   Unable to perform due to telephone visit only.  No results found for this or any previous visit.    Assessment & Plan:   Problem List Items Addressed This Visit      Other   ADHD    Chronic, ongoing.  At this time he would like to reduce dose, will reduce Vyvanse to 30 MG daily and monitor.  He will alert [place] if this does not work as well or if any issues with dose change.  Refills sent in and PDMP reviewed.  Next visit obtain UDS and controlled substance contract.  Return in 3 months.         I discussed the assessment and treatment plan with the patient. The patient was provided an opportunity to ask questions and all were answered. The patient agreed with the plan and  demonstrated an understanding of the instructions.   The patient was advised to call back or seek an in-person evaluation if the symptoms worsen or if the condition fails to improve as anticipated.   I provided 21+ minutes of time during this encounter.  Follow up plan: Return in about 3 months (around 02/22/2021) for ADHD -- needs UDS and contract.

## 2020-11-23 NOTE — Patient Instructions (Signed)

## 2020-11-23 NOTE — Assessment & Plan Note (Signed)
Chronic, ongoing.  At this time he would like to reduce dose, will reduce Vyvanse to 30 MG daily and monitor.  He will alert [place] if this does not work as well or if any issues with dose change.  Refills sent in and PDMP reviewed.  Next visit obtain UDS and controlled substance contract.  Return in 3 months.

## 2021-02-22 ENCOUNTER — Other Ambulatory Visit: Payer: Self-pay

## 2021-02-22 ENCOUNTER — Ambulatory Visit (INDEPENDENT_AMBULATORY_CARE_PROVIDER_SITE_OTHER): Admitting: Nurse Practitioner

## 2021-02-22 ENCOUNTER — Encounter: Payer: Self-pay | Admitting: Nurse Practitioner

## 2021-02-22 VITALS — BP 126/77 | HR 74 | Temp 97.9°F | Ht 69.84 in | Wt 160.1 lb

## 2021-02-22 DIAGNOSIS — R221 Localized swelling, mass and lump, neck: Secondary | ICD-10-CM

## 2021-02-22 DIAGNOSIS — Z79899 Other long term (current) drug therapy: Secondary | ICD-10-CM | POA: Diagnosis not present

## 2021-02-22 DIAGNOSIS — F909 Attention-deficit hyperactivity disorder, unspecified type: Secondary | ICD-10-CM | POA: Diagnosis not present

## 2021-02-22 NOTE — Assessment & Plan Note (Signed)
Chronic.  Controlled.  Continue with current medication regimen.  Obtained UDS and controlled substance agreement during visit today.  Refills sent to the pharmacy.  Return to clinic in 3 months for reevaluation.  Call sooner if concerns arise.

## 2021-02-22 NOTE — Progress Notes (Signed)
BP 126/77   Pulse 74   Temp 97.9 F (36.6 C)   Ht 5' 9.84" (1.774 m)   Wt 160 lb 2 oz (72.6 kg)   SpO2 98%   BMI 23.08 kg/m    Subjective:    Patient ID: Robert Lane, male    DOB: 2000/11/22, 19 y.o.   MRN: [id number]  HPI: Robert Lane is a 20 y.o. male  Chief Complaint  Patient presents with   ADHD   Mass    Left side of neck   ADHD FOLLOW UP ADHD status: controlled Satisfied with current therapy: yes Medication compliance:  excellent compliance Controlled substance contract: no Previous psychiatry evaluation:  unsure Previous medications: yes adderall   Taking meds on weekends/vacations: yes Work/school performance:  excellent Difficulty sustaining attention/completing tasks: no Distracted by extraneous stimuli:  sometimes Does not listen when spoken to: no  Fidgets with hands or feet: yes Unable to stay in seat: no Blurts out/interrupts others: no ADHD Medication Side Effects: no    Decreased appetite: no    Headache: no    Sleeping disturbance pattern: yes    Irritability: no    Rebound effects (worse than baseline) off medication: no    Anxiousness: no    Dizziness: no    Tics: no   Patient states he has a lump on his neck.  Does not know what it is.  Patient states it doesn't hurt.  Not sure how long it has been there.    Relevant past medical, surgical, family and social history reviewed and updated as indicated. Interim medical history since our last visit reviewed. Allergies and medications reviewed and updated.  Review of Systems  Neurological:  Negative for dizziness and headaches.  Psychiatric/Behavioral:  Positive for decreased concentration and sleep disturbance. Negative for agitation, behavioral problems and dysphoric mood. The patient is not nervous/anxious and is not hyperactive.    Per HPI unless specifically indicated above     Objective:    BP 126/77   Pulse 74   Temp 97.9 F (36.6 C)   Ht 5' 9.84" (1.774 m)   Wt 160 lb 2  oz (72.6 kg)   SpO2 98%   BMI 23.08 kg/m   Wt Readings from Last 3 Encounters:  02/22/21 160 lb 2 oz (72.6 kg)  05/19/20 150 lb (68 kg)  04/24/20 145 lb (65.8 kg)    Physical Exam Vitals and nursing note reviewed.  Constitutional:      General: He is not in acute distress.    Appearance: Normal appearance. He is not ill-appearing, toxic-appearing or diaphoretic.  HENT:     Head: Normocephalic.     Right Ear: External ear normal.     Left Ear: External ear normal.     Nose: Nose normal. No congestion or rhinorrhea.     Mouth/Throat:     Mouth: Mucous membranes are moist.  Eyes:     General:        Right eye: No discharge.        Left eye: No discharge.     Extraocular Movements: Extraocular movements intact.     Conjunctiva/sclera: Conjunctivae normal.     Pupils: Pupils are equal, round, and reactive to light.  Neck:   Cardiovascular:     Rate and Rhythm: Normal rate and regular rhythm.     Heart sounds: No murmur heard. Pulmonary:     Effort: Pulmonary effort is normal. No respiratory distress.     Breath sounds: Normal  breath sounds. No wheezing, rhonchi or rales.  Abdominal:     General: Abdomen is flat. Bowel sounds are normal.  Musculoskeletal:     Cervical back: Normal range of motion and neck supple.  Skin:    General: Skin is warm and dry.     Capillary Refill: Capillary refill takes less than 2 seconds.  Neurological:     General: No focal deficit present.     Mental Status: He is alert and oriented to person, place, and time.  Psychiatric:        Mood and Affect: Mood normal.        Behavior: Behavior normal.        Thought Content: Thought content normal.        Judgment: Judgment normal.    No results found for this or any previous visit.    Assessment & Plan:   Problem List Items Addressed This Visit       Other   ADHD - Primary    Chronic.  Controlled.  Continue with current medication regimen.  Obtained UDS and controlled substance  agreement during visit today.  Refills sent to the pharmacy.  Return to clinic in 3 months for reevaluation.  Call sooner if concerns arise.         Relevant Orders   [id number] 11+Oxyco+Alc+Crt-Bund   Other Visit Diagnoses     Controlled substance agreement signed       Controlled substance agrement and UDS obtained during visi totday.    Relevant Orders   [id number] 11+Oxyco+Alc+Crt-Bund   Mass present on one side of neck       Will obtain US. Likely an enlarged lymph node. Will make recommendations based on imaging results.    Relevant Orders   US Soft Tissue Head/Neck (NON-THYROID)        Follow up plan: Return in about 3 months (around 05/25/2021) for ADHD FU (can be virtual if patient activates mychart).    A total of 30 minutes were spent on this encounter today.  When total time is documented, this includes both the face-to-face and non-face-to-face time personally spent before, during and after the visit on the date of the encounter.

## 2021-02-24 ENCOUNTER — Other Ambulatory Visit: Payer: Self-pay | Admitting: Nurse Practitioner

## 2021-02-24 MED ORDER — LISDEXAMFETAMINE DIMESYLATE 30 MG PO CAPS: 30.0000 mg | ORAL_CAPSULE | Freq: Every day | ORAL | 0 refills | Status: DC

## 2021-02-24 NOTE — Telephone Encounter (Signed)
Called melinda pt's mom advised rx has been sent she verbalized understanding

## 2021-02-24 NOTE — Telephone Encounter (Signed)
Medication Refill - Medication: Pt is out and would like Rx sent today   lisdexamfetamine (VYVANSE) 30 MG capsule   Has the patient contacted their pharmacy? No. (Agent: If no, request that the patient contact the pharmacy for the refill.) (Agent: If yes, when and what did the pharmacy advise?)  Preferred Pharmacy (with phone number or street name): [id number] [place], [place] - [address] AT [facility] [address] [facility]  [address] [place], [place] [address]  Phone:  [phone]  Fax:  [phone]   Agent: Please be advised that RX refills may take up to 3 business days. We ask that you follow-up with your pharmacy.

## 2021-02-24 NOTE — Telephone Encounter (Signed)
Requested medication (s) are due for refill today - yes  Requested medication (s) are on the active medication list -yes  Future visit scheduled -yes  Last refill: 01/22/21  Notes to clinic: Request RF- non delegated Rx  Requested Prescriptions  Pending Prescriptions Disp Refills   lisdexamfetamine (VYVANSE) 30 MG capsule 30 capsule 0    Sig: Take 1 capsule (30 mg total) by mouth daily.      Not Delegated - Psychiatry:  Stimulants/ADHD Failed - 02/24/2021  3:35 PM      Failed - This refill cannot be delegated      Failed - Urine Drug Screen completed in last 360 days      Passed - Valid encounter within last 3 months    Recent Outpatient Visits           2 days ago Attention deficit hyperactivity disorder (ADHD), unspecified ADHD type   [facility] Larae Grooms, NP   3 months ago Attention deficit hyperactivity disorder (ADHD), unspecified ADHD type   [facility] [place], Dorie Rank, NP   6 months ago Attention deficit hyperactivity disorder (ADHD), unspecified ADHD type   [facility] [place], Corrie Dandy T, NP   9 months ago Attention deficit hyperactivity disorder (ADHD), unspecified ADHD type   [facility] [place], Megan P, DO   1 year ago Attention deficit hyperactivity disorder (ADHD), unspecified ADHD type   [facility] Particia Nearing, [place]       Future Appointments             In 3 months Larae Grooms, NP [facility]                 Requested Prescriptions  Pending Prescriptions Disp Refills   lisdexamfetamine (VYVANSE) 30 MG capsule 30 capsule 0    Sig: Take 1 capsule (30 mg total) by mouth daily.      Not Delegated - Psychiatry:  Stimulants/ADHD Failed - 02/24/2021  3:35 PM      Failed - This refill cannot be delegated      Failed - Urine Drug Screen completed in last 360 days      Passed - Valid encounter within last 3 months    Recent Outpatient Visits            2 days ago Attention deficit hyperactivity disorder (ADHD), unspecified ADHD type   [facility] Larae Grooms, NP   3 months ago Attention deficit hyperactivity disorder (ADHD), unspecified ADHD type   [facility] [place], Dorie Rank, NP   6 months ago Attention deficit hyperactivity disorder (ADHD), unspecified ADHD type   [facility] [place], Corrie Dandy T, NP   9 months ago Attention deficit hyperactivity disorder (ADHD), unspecified ADHD type   [facility] [place], Megan P, DO   1 year ago Attention deficit hyperactivity disorder (ADHD), unspecified ADHD type   [facility] Particia Nearing, [place]       Future Appointments             In 3 months Larae Grooms, NP [facility]

## 2021-02-24 NOTE — Telephone Encounter (Signed)
Pt seen yesterday scheduled 05/25/21

## 2021-03-02 LAB — DRUG SCREEN 764883 11+OXYCO+ALC+CRT-BUND
BENZODIAZ UR QL: NEGATIVE ng/mL
Barbiturate: NEGATIVE ng/mL
Cocaine (Metabolite): NEGATIVE ng/mL
Creatinine: 198.2 mg/dL (ref 20.0–300.0)
Ethanol: NEGATIVE %
Meperidine: NEGATIVE ng/mL
Methadone Screen, Urine: NEGATIVE ng/mL
OPIATE SCREEN URINE: NEGATIVE ng/mL
Oxycodone/Oxymorphone, Urine: NEGATIVE ng/mL
Phencyclidine: NEGATIVE ng/mL
Propoxyphene: NEGATIVE ng/mL
Tramadol: NEGATIVE ng/mL
pH, Urine: 5.6 (ref 4.5–8.9)

## 2021-03-02 LAB — DRUG PROFILE 799016
Amphetamine GC/MS Conf: >3000 ng/mL
Amphetamine: POSITIVE — AB
Amphetamines: POSITIVE — AB
Methamphetamine: NEGATIVE

## 2021-03-02 LAB — CANNABINOID CONFIRMATION, UR
CANNABINOIDS: POSITIVE — AB
Carboxy THC GC/MS Conf: 35 ng/mL

## 2021-03-02 NOTE — Progress Notes (Signed)
Please let patient know that his drug screen shows that he is positive for amphetamines which is expected with the medication he is on. He is also positive for cannabinoids. I don't recommend using the two together.  Please let me know if he has any questions.

## 2021-03-14 ENCOUNTER — Other Ambulatory Visit: Payer: Self-pay

## 2021-03-14 ENCOUNTER — Ambulatory Visit
Admission: RE | Admit: 2021-03-14 | Discharge: 2021-03-14 | Disposition: A | Discharge status: Home / Self Care | Source: Ambulatory Visit | Attending: Nurse Practitioner | Admitting: Nurse Practitioner

## 2021-03-14 DIAGNOSIS — R221 Localized swelling, mass and lump, neck: Secondary | ICD-10-CM | POA: Diagnosis not present

## 2021-03-19 NOTE — Progress Notes (Signed)
Please let patient know that his US showed that he has a normal lymph node in his neck.

## 2021-03-28 ENCOUNTER — Other Ambulatory Visit: Payer: Medicaid Other | Admitting: Nurse Practitioner

## 2021-03-28 NOTE — Telephone Encounter (Signed)
Requested medications are due for refill today.  yes  Requested medications are on the active medications list.  yes  Last refill. 02/24/2021  Future visit scheduled.   yeys  Notes to clinic.  Medication not delegated.

## 2021-03-28 NOTE — Telephone Encounter (Signed)
Copied from CRM [id number]. Topic: Quick Communication - Rx Refill/Question >> Mar 28, 2021  1:27 PM Jaquita Rector A wrote: Medication: lisdexamfetamine (VYVANSE) 30 MG capsule   Has the patient contacted their pharmacy? Yes.   (Agent: If no, request that the patient contact the pharmacy for the refill.) (Agent: If yes, when and what did the pharmacy advise?)  Preferred Pharmacy (with phone number or street name): [facility] #[id number] Cheree Ditto, [place] - [address] AT [facility] OF [address] [facility]  Phone:  [phone] Fax:  [phone]     Agent: Please be advised that RX refills may take up to 3 business days. We ask that you follow-up with your pharmacy.

## 2021-03-29 MED ORDER — LISDEXAMFETAMINE DIMESYLATE 30 MG PO CAPS: 30.0000 mg | ORAL_CAPSULE | Freq: Every day | ORAL | 0 refills | Status: DC

## 2021-03-29 NOTE — Telephone Encounter (Signed)
Patient last seen 02/22/21 and has appointment 05/25/21

## 2021-05-08 ENCOUNTER — Telehealth: Payer: Self-pay | Admitting: Nurse Practitioner

## 2021-05-08 MED ORDER — LISDEXAMFETAMINE DIMESYLATE 30 MG PO CAPS: 30.0000 mg | ORAL_CAPSULE | Freq: Every day | ORAL | 0 refills | Status: DC

## 2021-05-08 NOTE — Telephone Encounter (Signed)
Left message advising refill has been sent.

## 2021-05-08 NOTE — Telephone Encounter (Signed)
Medication Refill - Medication: lisdexamfetamine (VYVANSE) 30 MG capsule    Has the patient contacted their pharmacy? Yes.   No refills on file, contact pcp  Preferred Pharmacy (with phone number or street name):  [facility] #[id number] Cheree Ditto, [place] - [address] AT [facility] OF [address] [place]  [address] [place], [place] [address]  Phone:  [phone]  Fax:  [phone]   Has the patient been seen for an appointment in the last year OR does the patient have an upcoming appointment? Yes.    Agent: Please be advised that RX refills may take up to 3 business days. We ask that you follow-up with your pharmacy.

## 2021-05-24 NOTE — Progress Notes (Deleted)
There were no vitals taken for this visit.   Subjective:    Patient ID: Robert Lane, male    DOB: Jul 20, 2001, 20 y.o.   MRN: [id number]  HPI: Robert Lane is a 20 y.o. male  No chief complaint on file.  ADHD FOLLOW UP ADHD status: controlled Satisfied with current therapy: yes Medication compliance:  excellent compliance Controlled substance contract: no Previous psychiatry evaluation:  unsure Previous medications: yes adderall   Taking meds on weekends/vacations: yes Work/school performance:  excellent Difficulty sustaining attention/completing tasks: no Distracted by extraneous stimuli:  sometimes Does not listen when spoken to: no  Fidgets with hands or feet: yes Unable to stay in seat: no Blurts out/interrupts others: no ADHD Medication Side Effects: no    Decreased appetite: no    Headache: no    Sleeping disturbance pattern: yes    Irritability: no    Rebound effects (worse than baseline) off medication: no    Anxiousness: no    Dizziness: no    Tics: no   Patient states he has a lump on his neck.  Does not know what it is.  Patient states it doesn't hurt.  Not sure how long it has been there.    Relevant past medical, surgical, family and social history reviewed and updated as indicated. Interim medical history since our last visit reviewed. Allergies and medications reviewed and updated.  Review of Systems  Neurological:  Negative for dizziness and headaches.  Psychiatric/Behavioral:  Positive for decreased concentration and sleep disturbance. Negative for agitation, behavioral problems and dysphoric mood. The patient is not nervous/anxious and is not hyperactive.    Per HPI unless specifically indicated above     Objective:    There were no vitals taken for this visit.  Wt Readings from Last 3 Encounters:  02/22/21 160 lb 2 oz (72.6 kg)  05/19/20 150 lb (68 kg)  04/24/20 145 lb (65.8 kg)    Physical Exam Vitals and nursing note reviewed.   Constitutional:      General: He is not in acute distress.    Appearance: Normal appearance. He is not ill-appearing, toxic-appearing or diaphoretic.  HENT:     Head: Normocephalic.     Right Ear: External ear normal.     Left Ear: External ear normal.     Nose: Nose normal. No congestion or rhinorrhea.     Mouth/Throat:     Mouth: Mucous membranes are moist.  Eyes:     General:        Right eye: No discharge.        Left eye: No discharge.     Extraocular Movements: Extraocular movements intact.     Conjunctiva/sclera: Conjunctivae normal.     Pupils: Pupils are equal, round, and reactive to light.  Neck:   Cardiovascular:     Rate and Rhythm: Normal rate and regular rhythm.     Heart sounds: No murmur heard. Pulmonary:     Effort: Pulmonary effort is normal. No respiratory distress.     Breath sounds: Normal breath sounds. No wheezing, rhonchi or rales.  Abdominal:     General: Abdomen is flat. Bowel sounds are normal.  Musculoskeletal:     Cervical back: Normal range of motion and neck supple.  Skin:    General: Skin is warm and dry.     Capillary Refill: Capillary refill takes less than 2 seconds.  Neurological:     General: No focal deficit present.     Mental Status:  He is alert and oriented to person, place, and time.  Psychiatric:        Mood and Affect: Mood normal.        Behavior: Behavior normal.        Thought Content: Thought content normal.        Judgment: Judgment normal.    Results for orders placed or performed in visit on 02/22/21  [id number] 11+Oxyco+Alc+Crt-Bund  Result Value Ref Range   Ethanol Negative Cutoff=0.020 %   Amphetamines, Urine See Final Results Cutoff=1000 ng/mL   Barbiturate Negative Cutoff=200 ng/mL   BENZODIAZ UR QL Negative Cutoff=200 ng/mL   Cannabinoid Quant, Ur See Final Results Cutoff=50 ng/mL   Cocaine (Metabolite) Negative Cutoff=300 ng/mL   OPIATE SCREEN URINE Negative Cutoff=300 ng/mL   Oxycodone/Oxymorphone, Urine  Negative Cutoff=300 ng/mL   Phencyclidine Negative Cutoff=25 ng/mL   Methadone Screen, Urine Negative Cutoff=300 ng/mL   Propoxyphene Negative Cutoff=300 ng/mL   Meperidine Negative Cutoff=200 ng/mL   Tramadol Negative Cutoff=200 ng/mL   Creatinine 198.2 20.0 - 300.0 mg/dL   pH, Urine 5.6 4.5 - 8.9  Drug Profile [phone]  Result Value Ref Range   Amphetamines Positive (A) Cutoff=1000   Amphetamine Positive (A)    Amphetamine GC/MS Conf >3000 Cutoff=500 ng/mL   Methamphetamine Negative Cutoff=500  Cannabinoid Conf, Ur  Result Value Ref Range   CANNABINOIDS Positive (A) Cutoff=50   Carboxy THC GC/MS Conf 35 Cutoff=15 ng/mL      Assessment & Plan:   Problem List Items Addressed This Visit      Other   ADHD - Primary     Follow up plan: No follow-ups on file.    A total of 30 minutes were spent on this encounter today.  When total time is documented, this includes both the face-to-face and non-face-to-face time personally spent before, during and after the visit on the date of the encounter.

## 2021-05-25 ENCOUNTER — Encounter: Payer: Self-pay | Admitting: Nurse Practitioner

## 2021-05-25 ENCOUNTER — Telehealth (INDEPENDENT_AMBULATORY_CARE_PROVIDER_SITE_OTHER): Payer: Medicaid Other | Admitting: Nurse Practitioner

## 2021-05-25 DIAGNOSIS — F909 Attention-deficit hyperactivity disorder, unspecified type: Secondary | ICD-10-CM

## 2021-05-26 NOTE — Progress Notes (Signed)
No showed for appt

## 2021-06-05 ENCOUNTER — Telehealth: Payer: Self-pay | Admitting: Nurse Practitioner

## 2021-06-05 NOTE — Telephone Encounter (Signed)
Medication Refill - Medication: lisdexamfetamine (VYVANSE) 30 MG capsule  Has the patient contacted their pharmacy? No. (Agent: If no, request that the patient contact the pharmacy for the refill. If patient does not wish to contact the pharmacy document the reason why and proceed with request.) (Agent: If yes, when and what did the pharmacy advise?)usually calls this into office because of they type of medication   Preferred Pharmacy (with phone number or street name): [facility] #[id number] Cheree Ditto, [place] - [address] AT [facility] OF [address] [place]  [address] [place], [place] [address]  Phone:  [phone]  Fax:  [phone]  Has the patient been seen for an appointment in the last year OR does the patient have an upcoming appointment? Yes.    Agent: Please be advised that RX refills may take up to 3 business days. We ask that you follow-up with your pharmacy.

## 2021-06-05 NOTE — Telephone Encounter (Signed)
Fyi.

## 2021-06-05 NOTE — Telephone Encounter (Signed)
Requested medications are due for refill today.  yes  Requested medications are on the active medications list.  yes  Last refill. 05/08/2021  Future visit scheduled.   no  Notes to clinic.  Medication not delegated.

## 2021-06-08 ENCOUNTER — Ambulatory Visit: Payer: Self-pay

## 2021-06-08 NOTE — Telephone Encounter (Signed)
Pt is scheduled for appt tomorrow morning with Clydie Braun.

## 2021-06-08 NOTE — Telephone Encounter (Signed)
Patient's mother checking sttus of refill.

## 2021-06-08 NOTE — Telephone Encounter (Signed)
Pt's mother scheduled next available appt, she says patient desperately needs to have his Rx for work. He works 12 hour shifts. Needs enough called in to last him this weekend for work.

## 2021-06-08 NOTE — Telephone Encounter (Signed)
Addressed by K. Mabe in triage. Please see encounter.

## 2021-06-08 NOTE — Progress Notes (Deleted)
There were no vitals taken for this visit.   Subjective:    Patient ID: Robert Lane, male    DOB: [birth year], 20 y.o.   MRN: [id number]  HPI: Robert Lane is a 20 y.o. male  No chief complaint on file.  ADHD FOLLOW UP ADHD status: controlled Satisfied with current therapy: yes Medication compliance:  excellent compliance Controlled substance contract: no Previous psychiatry evaluation:  unsure Previous medications: yes adderall   Taking meds on weekends/vacations: yes Work/school performance:  excellent Difficulty sustaining attention/completing tasks: no Distracted by extraneous stimuli:  sometimes Does not listen when spoken to: no  Fidgets with hands or feet: yes Unable to stay in seat: no Blurts out/interrupts others: no ADHD Medication Side Effects: no    Decreased appetite: no    Headache: no    Sleeping disturbance pattern: yes    Irritability: no    Rebound effects (worse than baseline) off medication: no    Anxiousness: no    Dizziness: no    Tics: no    Relevant past medical, surgical, family and social history reviewed and updated as indicated. Interim medical history since our last visit reviewed. Allergies and medications reviewed and updated.  Review of Systems  Neurological:  Negative for dizziness and headaches.  Psychiatric/Behavioral:  Positive for decreased concentration and sleep disturbance. Negative for agitation, behavioral problems and dysphoric mood. The patient is not nervous/anxious and is not hyperactive.    Per HPI unless specifically indicated above     Objective:    There were no vitals taken for this visit.  Wt Readings from Last 3 Encounters:  02/22/21 160 lb 2 oz (72.6 kg)  05/19/20 150 lb (68 kg)  04/24/20 145 lb (65.8 kg)    Physical Exam Vitals and nursing note reviewed.  Constitutional:      General: He is not in acute distress.    Appearance: Normal appearance. He is not ill-appearing, toxic-appearing or  diaphoretic.  HENT:     Head: Normocephalic.     Right Ear: External ear normal.     Left Ear: External ear normal.     Nose: Nose normal. No congestion or rhinorrhea.     Mouth/Throat:     Mouth: Mucous membranes are moist.  Eyes:     General:        Right eye: No discharge.        Left eye: No discharge.     Extraocular Movements: Extraocular movements intact.     Conjunctiva/sclera: Conjunctivae normal.     Pupils: Pupils are equal, round, and reactive to light.  Neck:   Cardiovascular:     Rate and Rhythm: Normal rate and regular rhythm.     Heart sounds: No murmur heard. Pulmonary:     Effort: Pulmonary effort is normal. No respiratory distress.     Breath sounds: Normal breath sounds. No wheezing, rhonchi or rales.  Abdominal:     General: Abdomen is flat. Bowel sounds are normal.  Musculoskeletal:     Cervical back: Normal range of motion and neck supple.  Skin:    General: Skin is warm and dry.     Capillary Refill: Capillary refill takes less than 2 seconds.  Neurological:     General: No focal deficit present.     Mental Status: He is alert and oriented to person, place, and time.  Psychiatric:        Mood and Affect: Mood normal.        Behavior: Behavior  normal.        Thought Content: Thought content normal.        Judgment: Judgment normal.    Results for orders placed or performed in visit on 02/22/21  [id number] 11+Oxyco+Alc+Crt-Bund  Result Value Ref Range   Ethanol Negative Cutoff=0.020 %   Amphetamines, Urine See Final Results Cutoff=1000 ng/mL   Barbiturate Negative Cutoff=200 ng/mL   BENZODIAZ UR QL Negative Cutoff=200 ng/mL   Cannabinoid Quant, Ur See Final Results Cutoff=50 ng/mL   Cocaine (Metabolite) Negative Cutoff=300 ng/mL   OPIATE SCREEN URINE Negative Cutoff=300 ng/mL   Oxycodone/Oxymorphone, Urine Negative Cutoff=300 ng/mL   Phencyclidine Negative Cutoff=25 ng/mL   Methadone Screen, Urine Negative Cutoff=300 ng/mL   Propoxyphene Negative  Cutoff=300 ng/mL   Meperidine Negative Cutoff=200 ng/mL   Tramadol Negative Cutoff=200 ng/mL   Creatinine 198.2 20.0 - 300.0 mg/dL   pH, Urine 5.6 4.5 - 8.9  Drug Profile [phone]  Result Value Ref Range   Amphetamines Positive (A) Cutoff=1000   Amphetamine Positive (A)    Amphetamine GC/MS Conf >3000 Cutoff=500 ng/mL   Methamphetamine Negative Cutoff=500  Cannabinoid Conf, Ur  Result Value Ref Range   CANNABINOIDS Positive (A) Cutoff=50   Carboxy THC GC/MS Conf 35 Cutoff=15 ng/mL      Assessment & Plan:   Problem List Items Addressed This Visit   None    Follow up plan: No follow-ups on file.    A total of 30 minutes were spent on this encounter today.  When total time is documented, this includes both the face-to-face and non-face-to-face time personally spent before, during and after the visit on the date of the encounter.

## 2021-06-08 NOTE — Telephone Encounter (Signed)
1836 - pt called, informed him of Dr. Yetta Barre information and let him know he will have to wait until his appt tomorrow at 1120. Advised he could call in the morning and see if office has any sooner appts. Pt states he is ok with this info. Care advice given and pt verbalized understanding. No other questions/concerns noted.    48- Spoke with Dr. Yetta Barre, MD about pt being out of vyvanse and being pt of Clydie Braun, NP. Advised pt has been out for 2 days and has appt tomorrow at 1120 with Clydie Braun, NP. Per Dr. Yetta Barre she is unable to fill this medication d/t controlled substance and this is not her pt. Advised I would reach back out to patient and let him aware.   21- Development worker, community # called and spoke to [employer], Pensions consultant about the refill(s) VYVANSE 30mg  requested. Asked if pt had any refills on this medication, she states no. Asked if anything was faxed in from office today per mom and she denied that any faxes were received today for medication.

## 2021-06-08 NOTE — Telephone Encounter (Signed)
53- Mother call back, stating her son is out of VYVANSE for 2 days and is needing a refill to get him thru the weekend since he has to work until appt on Monday 11/14. Advised mother that Monday appt was cancelled and pt has appt tomorrow at 1120 with Clydie Braun, NP. 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 Advised I would call on call provider but because on call isnt the provider who prescribed med and this is controlled substance was highly likely they would not. Informed mother I would reach back out to pt or herself after getting information needed.    Reason for Disposition . [1] Caller has NON-URGENT medicine question about med that PCP prescribed AND [2] triager unable to answer question  Answer Assessment - Initial Assessment Questions 1. NAME of MEDICATION: "What medicine are you calling about?"     VYVANSE  2. QUESTION: "What is your question?" (e.g., double dose of medicine, side effect)     Wanting to know if can get 1 time dose for tonight since pt is ADHD.  3. PRESCRIBING HCP: "Who prescribed it?" Reason: if prescribed by specialist, call should be referred to that group.     Clydie Braun, NP 4. SYMPTOMS: "Do you have any symptoms?"     ADHD increased 5. SEVERITY: If symptoms are present, ask "Are they mild, moderate or severe?"     Moderate-severe 6. PREGNANCY:  "Is there any chance that you are pregnant?" "When was your last menstrual period?"     N/A  Protocols used: Medication Question Call-A-AH

## 2021-06-09 ENCOUNTER — Telehealth: Payer: Medicaid Other | Admitting: Nurse Practitioner

## 2021-06-09 ENCOUNTER — Ambulatory Visit: Payer: Self-pay | Admitting: *Deleted

## 2021-06-09 DIAGNOSIS — F909 Attention-deficit hyperactivity disorder, unspecified type: Secondary | ICD-10-CM

## 2021-06-09 NOTE — Telephone Encounter (Signed)
Reason for Disposition  [1] Prescription refill request for NON-ESSENTIAL medicine (i.e., no harm to patient if med not taken) AND [2] triager unable to refill per department policy  Answer Assessment - Initial Assessment Questions 1. DRUG NAME: "What medicine do you need to have refilled?"     Vyvanse 30 mg  2. REFILLS REMAINING: "How many refills are remaining?" (Note: The label on the medicine or pill bottle will show how many refills are remaining. If there are no refills remaining, then a renewal may be needed.)     0 3. EXPIRATION DATE: "What is the expiration date?" (Note: The label states when the prescription will expire, and thus can no longer be refilled.)     na 4. PRESCRIBING HCP: "Who prescribed it?" Reason: If prescribed by specialist, call should be referred to that group.     PCP 5. SYMPTOMS: "Do you have any symptoms?"     Attention deficit 6. PREGNANCY: "Is there any chance that you are pregnant?" "When was your last menstrual period?"     na  Protocols used: Medication Refill and Renewal Call-A-AH

## 2021-06-09 NOTE — Telephone Encounter (Signed)
Patient's mother calling to request refill for patient vyvanse 30 mg . Patient has requested medication x 3 within the past 2 days. Virtual visit was scheduled for today and patient's mother reports patient took break from work for virtual visit and did not have visit and when he was called 3 hours later he was unable to get a break to complete the virtual visit. Requesting for refill of vyvanse 30 mg . Requested patient's mother call pharmacy to see if medication refilled. Please advise. Recommended if no refill provided patient would need to go to UC or ED if symptoms worsening. Care advise given. Patient 's mother verbalized understanding of care advise and to call back or take patient to UC or ED.

## 2021-06-11 NOTE — Progress Notes (Signed)
There were no vitals taken for this visit.   Subjective:    Patient ID: Robert Lane, male    DOB: May 25, 2001, 20 y.o.   MRN: [id number]  HPI: Robert Lane is a 20 y.o. male  Chief Complaint  Patient presents with   ADHD   ADHD FOLLOW UP ADHD status: controlled Satisfied with current therapy: yes Medication compliance:  excellent compliance Controlled substance contract: no Previous psychiatry evaluation:  unsure Previous medications: yes adderall   Taking meds on weekends/vacations: yes Work/school performance:  excellent Difficulty sustaining attention/completing tasks: no Distracted by extraneous stimuli:  sometimes Does not listen when spoken to: no  Fidgets with hands or feet: yes Unable to stay in seat: no Blurts out/interrupts others: no ADHD Medication Side Effects: no    Decreased appetite: no    Headache: no    Sleeping disturbance pattern: yes    Irritability: no    Rebound effects (worse than baseline) off medication: no    Anxiousness: no    Dizziness: no    Tics: no He has been off the medication for a couple of days.  He is wondering if he can decrease his dose of Vyvanse and still have similar effects.    Relevant past medical, surgical, family and social history reviewed and updated as indicated. Interim medical history since our last visit reviewed. Allergies and medications reviewed and updated.  Review of Systems  Neurological:  Negative for dizziness and headaches.  Psychiatric/Behavioral:  Positive for decreased concentration and sleep disturbance. Negative for agitation, behavioral problems and dysphoric mood. The patient is not nervous/anxious and is not hyperactive.    Per HPI unless specifically indicated above     Objective:    There were no vitals taken for this visit.  Wt Readings from Last 3 Encounters:  02/22/21 160 lb 2 oz (72.6 kg)  05/19/20 150 lb (68 kg)  04/24/20 145 lb (65.8 kg)    Physical Exam Vitals and nursing  note reviewed.  Constitutional:      General: He is not in acute distress.    Appearance: He is not ill-appearing.  HENT:     Head: Normocephalic.     Right Ear: Hearing normal.     Left Ear: Hearing normal.     Nose: Nose normal.  Pulmonary:     Effort: Pulmonary effort is normal. No respiratory distress.  Neurological:     Mental Status: He is alert.  Psychiatric:        Mood and Affect: Mood normal.        Behavior: Behavior normal.        Thought Content: Thought content normal.        Judgment: Judgment normal.    Results for orders placed or performed in visit on 02/22/21  [id number] 11+Oxyco+Alc+Crt-Bund  Result Value Ref Range   Ethanol Negative Cutoff=0.020 %   Amphetamines, Urine See Final Results Cutoff=1000 ng/mL   Barbiturate Negative Cutoff=200 ng/mL   BENZODIAZ UR QL Negative Cutoff=200 ng/mL   Cannabinoid Quant, Ur See Final Results Cutoff=50 ng/mL   Cocaine (Metabolite) Negative Cutoff=300 ng/mL   OPIATE SCREEN URINE Negative Cutoff=300 ng/mL   Oxycodone/Oxymorphone, Urine Negative Cutoff=300 ng/mL   Phencyclidine Negative Cutoff=25 ng/mL   Methadone Screen, Urine Negative Cutoff=300 ng/mL   Propoxyphene Negative Cutoff=300 ng/mL   Meperidine Negative Cutoff=200 ng/mL   Tramadol Negative Cutoff=200 ng/mL   Creatinine 198.2 20.0 - 300.0 mg/dL   pH, Urine 5.6 4.5 - 8.9  Drug Profile [phone]  Result Value Ref Range   Amphetamines Positive (A) Cutoff=1000   Amphetamine Positive (A)    Amphetamine GC/MS Conf >3000 Cutoff=500 ng/mL   Methamphetamine Negative Cutoff=500  Cannabinoid Conf, Ur  Result Value Ref Range   CANNABINOIDS Positive (A) Cutoff=50   Carboxy THC GC/MS Conf 35 Cutoff=15 ng/mL      Assessment & Plan:   Problem List Items Addressed This Visit       Other   ADHD - Primary    Chronic. Patient is not having any side effects from the medication but feels like a lesser dose would be okay to control his ADHD symptoms. We will decrease the  Vyvanse to 20mg  daily. Follow up in 1 month to make sure it is controlling patient's symptoms.  PDMP checked. Refill sent.           Follow up plan: Return in about 1 month (around 07/12/2021) for ADHD FU.    This visit was completed via MyChart due to the restrictions of the COVID-19 pandemic. All issues as above were discussed and addressed. Physical exam was done as above through visual confirmation on MyChart. If it was felt that the patient should be evaluated in the office, they were directed there. The patient verbally consented to this visit. Location of the patient: Home Location of the provider: Office Those involved with this call:  Provider: 07/14/2021, NP CMA: Larae Grooms, CMA Front Desk/Registration: Wilhemena Durie This encounter was conducted via video.  I spent 20 dedicated to the care of this patient on the date of this encounter to include previsit review of 30, face to face time with the patient, and post visit ordering of testing.

## 2021-06-12 ENCOUNTER — Telehealth (INDEPENDENT_AMBULATORY_CARE_PROVIDER_SITE_OTHER): Admitting: Nurse Practitioner

## 2021-06-12 ENCOUNTER — Encounter: Payer: Self-pay | Admitting: Nurse Practitioner

## 2021-06-12 ENCOUNTER — Telehealth: Admitting: Nurse Practitioner

## 2021-06-12 DIAGNOSIS — F909 Attention-deficit hyperactivity disorder, unspecified type: Secondary | ICD-10-CM

## 2021-06-12 MED ORDER — LISDEXAMFETAMINE DIMESYLATE 20 MG PO CAPS: 20.0000 mg | ORAL_CAPSULE | Freq: Every day | ORAL | 0 refills | Status: DC

## 2021-06-12 NOTE — Telephone Encounter (Signed)
Spoke with patient mother and informed her that patient would need to keep scheduled appointment for today with provider in order to receive medication. Patient mother verbalized understanding and states she will make sure patient is here today for appointment.

## 2021-06-12 NOTE — Telephone Encounter (Signed)
Yes patient has no showed 3 times.  Cannot receive medication without appointment.

## 2021-06-12 NOTE — Progress Notes (Signed)
Appointment has been made

## 2021-06-12 NOTE — Assessment & Plan Note (Signed)
Chronic. Patient is not having any side effects from the medication but feels like a lesser dose would be okay to control his ADHD symptoms. We will decrease the Vyvanse to 20mg  daily. Follow up in 1 month to make sure it is controlling patient's symptoms.  PDMP checked. Refill sent.

## 2021-07-11 NOTE — Progress Notes (Deleted)
There were no vitals taken for this visit.   Subjective:    Patient ID: Robert Lane, male    DOB: 01/18/2001, 20 y.o.   MRN: [id number]  HPI: Robert Lane is a 20 y.o. male  No chief complaint on file.  ADHD FOLLOW UP ADHD status: controlled Satisfied with current therapy: yes Medication compliance:  excellent compliance Controlled substance contract: no Previous psychiatry evaluation:  unsure Previous medications: yes adderall   Taking meds on weekends/vacations: yes Work/school performance:  excellent Difficulty sustaining attention/completing tasks: no Distracted by extraneous stimuli:  sometimes Does not listen when spoken to: no  Fidgets with hands or feet: yes Unable to stay in seat: no Blurts out/interrupts others: no ADHD Medication Side Effects: no    Decreased appetite: no    Headache: no    Sleeping disturbance pattern: yes    Irritability: no    Rebound effects (worse than baseline) off medication: no    Anxiousness: no    Dizziness: no    Tics: no He has been off the medication for a couple of days.  He is wondering if he can decrease his dose of Vyvanse and still have similar effects.    Relevant past medical, surgical, family and social history reviewed and updated as indicated. Interim medical history since our last visit reviewed. Allergies and medications reviewed and updated.  Review of Systems  Neurological:  Negative for dizziness and headaches.  Psychiatric/Behavioral:  Positive for decreased concentration and sleep disturbance. Negative for agitation, behavioral problems and dysphoric mood. The patient is not nervous/anxious and is not hyperactive.    Per HPI unless specifically indicated above     Objective:    There were no vitals taken for this visit.  Wt Readings from Last 3 Encounters:  02/22/21 160 lb 2 oz (72.6 kg)  05/19/20 150 lb (68 kg)  04/24/20 145 lb (65.8 kg)    Physical Exam Vitals and nursing note reviewed.   Constitutional:      General: He is not in acute distress.    Appearance: He is not ill-appearing.  HENT:     Head: Normocephalic.     Right Ear: Hearing normal.     Left Ear: Hearing normal.     Nose: Nose normal.  Pulmonary:     Effort: Pulmonary effort is normal. No respiratory distress.  Neurological:     Mental Status: He is alert.  Psychiatric:        Mood and Affect: Mood normal.        Behavior: Behavior normal.        Thought Content: Thought content normal.        Judgment: Judgment normal.    Results for orders placed or performed in visit on 02/22/21  [id number] 11+Oxyco+Alc+Crt-Bund  Result Value Ref Range   Ethanol Negative Cutoff=0.020 %   Amphetamines, Urine See Final Results Cutoff=1000 ng/mL   Barbiturate Negative Cutoff=200 ng/mL   BENZODIAZ UR QL Negative Cutoff=200 ng/mL   Cannabinoid Quant, Ur See Final Results Cutoff=50 ng/mL   Cocaine (Metabolite) Negative Cutoff=300 ng/mL   OPIATE SCREEN URINE Negative Cutoff=300 ng/mL   Oxycodone/Oxymorphone, Urine Negative Cutoff=300 ng/mL   Phencyclidine Negative Cutoff=25 ng/mL   Methadone Screen, Urine Negative Cutoff=300 ng/mL   Propoxyphene Negative Cutoff=300 ng/mL   Meperidine Negative Cutoff=200 ng/mL   Tramadol Negative Cutoff=200 ng/mL   Creatinine 198.2 20.0 - 300.0 mg/dL   pH, Urine 5.6 4.5 - 8.9  Drug Profile [id number]  Result Value Ref Range  Amphetamines Positive (A) Cutoff=1000   Amphetamine Positive (A)    Amphetamine GC/MS Conf >3000 Cutoff=500 ng/mL   Methamphetamine Negative Cutoff=500  Cannabinoid Conf, Ur  Result Value Ref Range   CANNABINOIDS Positive (A) Cutoff=50   Carboxy THC GC/MS Conf 35 Cutoff=15 ng/mL      Assessment & Plan:   Problem List Items Addressed This Visit      Other   ADHD - Primary      Follow up plan: No follow-ups on file.    This visit was completed via MyChart due to the restrictions of the COVID-19 pandemic. All issues as above were discussed and  addressed. Physical exam was done as above through visual confirmation on MyChart. If it was felt that the patient should be evaluated in the office, they were directed there. The patient verbally consented to this visit. Location of the patient: Home Location of the provider: Office Those involved with this call:  Provider: Larae Grooms, NP CMA: Wilhemena Durie, CMA Front Desk/Registration: Channing Mutters This encounter was conducted via video.  I spent 20 dedicated to the care of this patient on the date of this encounter to include previsit review of 30, face to face time with the patient, and post visit ordering of testing.

## 2021-07-12 ENCOUNTER — Ambulatory Visit: Payer: Medicaid Other | Admitting: Nurse Practitioner

## 2021-07-12 DIAGNOSIS — F909 Attention-deficit hyperactivity disorder, unspecified type: Secondary | ICD-10-CM

## 2021-07-19 ENCOUNTER — Encounter: Payer: Self-pay | Admitting: Nurse Practitioner

## 2021-07-19 ENCOUNTER — Telehealth (INDEPENDENT_AMBULATORY_CARE_PROVIDER_SITE_OTHER): Payer: Medicaid Other | Admitting: Nurse Practitioner

## 2021-07-19 DIAGNOSIS — F909 Attention-deficit hyperactivity disorder, unspecified type: Secondary | ICD-10-CM

## 2021-07-19 MED ORDER — LISDEXAMFETAMINE DIMESYLATE 30 MG PO CAPS: 30.0000 mg | ORAL_CAPSULE | Freq: Every day | ORAL | 0 refills | Status: DC

## 2021-07-19 MED ORDER — LISDEXAMFETAMINE DIMESYLATE 20 MG PO CAPS: 20.0000 mg | ORAL_CAPSULE | Freq: Every day | ORAL | 0 refills | Status: DC

## 2021-07-19 NOTE — Progress Notes (Signed)
There were no vitals taken for this visit.   Subjective:    Patient ID: Robert Lane, male    DOB: 17-Jul-2001, 20 y.o.   MRN: [id number]  HPI: Robert Lane is a 20 y.o. male  Chief Complaint  Patient presents with   ADHD    Pt wants to discuss increasing medication   ADHD FOLLOW UP ADHD status: uncontrolled- Patient states the decreased wasn't enough.  He felt like he was lazy and not getting anything done.   Satisfied with current therapy: yes Medication compliance:  excellent compliance Controlled substance contract: no Previous psychiatry evaluation:  unsure Previous medications: yes adderall   Taking meds on weekends/vacations: yes Work/school performance:  excellent Difficulty sustaining attention/completing tasks: no Distracted by extraneous stimuli:  sometimes Does not listen when spoken to: no  Fidgets with hands or feet: yes Unable to stay in seat: no Blurts out/interrupts others: no ADHD Medication Side Effects: no    Decreased appetite: no    Headache: no    Sleeping disturbance pattern: yes    Irritability: no    Rebound effects (worse than baseline) off medication: no    Anxiousness: no    Dizziness: no    Tics: no    Relevant past medical, surgical, family and social history reviewed and updated as indicated. Interim medical history since our last visit reviewed. Allergies and medications reviewed and updated.  Review of Systems  Neurological:  Negative for dizziness and headaches.  Psychiatric/Behavioral:  Positive for decreased concentration and sleep disturbance. Negative for agitation, behavioral problems and dysphoric mood. The patient is not nervous/anxious and is not hyperactive.    Per HPI unless specifically indicated above     Objective:    There were no vitals taken for this visit.  Wt Readings from Last 3 Encounters:  02/22/21 160 lb 2 oz (72.6 kg)  05/19/20 150 lb (68 kg)  04/24/20 145 lb (65.8 kg)    Physical Exam Vitals and  nursing note reviewed.  Constitutional:      General: He is not in acute distress.    Appearance: He is not ill-appearing.  HENT:     Head: Normocephalic.     Right Ear: Hearing normal.     Left Ear: Hearing normal.     Nose: Nose normal.  Pulmonary:     Effort: Pulmonary effort is normal. No respiratory distress.  Neurological:     Mental Status: He is alert.  Psychiatric:        Mood and Affect: Mood normal.        Behavior: Behavior normal.        Thought Content: Thought content normal.        Judgment: Judgment normal.    Results for orders placed or performed in visit on 02/22/21  [id number] 11+Oxyco+Alc+Crt-Bund  Result Value Ref Range   Ethanol Negative Cutoff=0.020 %   Amphetamines, Urine See Final Results Cutoff=1000 ng/mL   Barbiturate Negative Cutoff=200 ng/mL   BENZODIAZ UR QL Negative Cutoff=200 ng/mL   Cannabinoid Quant, Ur See Final Results Cutoff=50 ng/mL   Cocaine (Metabolite) Negative Cutoff=300 ng/mL   OPIATE SCREEN URINE Negative Cutoff=300 ng/mL   Oxycodone/Oxymorphone, Urine Negative Cutoff=300 ng/mL   Phencyclidine Negative Cutoff=25 ng/mL   Methadone Screen, Urine Negative Cutoff=300 ng/mL   Propoxyphene Negative Cutoff=300 ng/mL   Meperidine Negative Cutoff=200 ng/mL   Tramadol Negative Cutoff=200 ng/mL   Creatinine 198.2 20.0 - 300.0 mg/dL   pH, Urine 5.6 4.5 - 8.9  Drug Profile [phone]  Result Value Ref Range   Amphetamines Positive (A) Cutoff=1000   Amphetamine Positive (A)    Amphetamine GC/MS Conf >3000 Cutoff=500 ng/mL   Methamphetamine Negative Cutoff=500  Cannabinoid Conf, Ur  Result Value Ref Range   CANNABINOIDS Positive (A) Cutoff=50   Carboxy THC GC/MS Conf 35 Cutoff=15 ng/mL      Assessment & Plan:   Problem List Items Addressed This Visit       Other   ADHD - Primary    Chronic. Patient did not feel like the Vyvanse 20mg  was enough to control his symptoms.  States he felt lazy and not able to get things done.  Would like to  increase back to Vyvanse 30mg . Will send refills to the pharmacy for patient. Follow up in 3 months. PDMP reviewed.         Follow up plan: Return in about 3 months (around 10/17/2021) for ADHD FU.    This visit was completed via MyChart due to the restrictions of the COVID-19 pandemic. All issues as above were discussed and addressed. Physical exam was done as above through visual confirmation on MyChart. If it was felt that the patient should be evaluated in the office, they were directed there. The patient verbally consented to this visit. Location of the patient: Home Location of the provider: Office Those involved with this call:  Provider: , NP CMA: 10/19/2021, CMA Front Desk/Registration: Larae Grooms This encounter was conducted via video.  I spent 20 dedicated to the care of this patient on the date of this encounter to include previsit review of 30, face to face time with the patient, and post visit ordering of testing.

## 2021-07-19 NOTE — Assessment & Plan Note (Signed)
Chronic. Patient did not feel like the Vyvanse 20mg  was enough to control his symptoms.  States he felt lazy and not able to get things done.  Would like to increase back to Vyvanse 30mg . Will send refills to the pharmacy for patient. Follow up in 3 months. PDMP reviewed.

## 2021-10-17 ENCOUNTER — Telehealth (INDEPENDENT_AMBULATORY_CARE_PROVIDER_SITE_OTHER): Admitting: Nurse Practitioner

## 2021-10-17 ENCOUNTER — Encounter: Payer: Self-pay | Admitting: Nurse Practitioner

## 2021-10-17 DIAGNOSIS — F909 Attention-deficit hyperactivity disorder, unspecified type: Secondary | ICD-10-CM | POA: Diagnosis not present

## 2021-10-17 MED ORDER — LISDEXAMFETAMINE DIMESYLATE 30 MG PO CAPS: 30.0000 mg | ORAL_CAPSULE | Freq: Every day | ORAL | 0 refills | Status: DC

## 2021-10-17 MED ORDER — LISDEXAMFETAMINE DIMESYLATE 20 MG PO CAPS: 20.0000 mg | ORAL_CAPSULE | Freq: Every day | ORAL | 0 refills | Status: DC

## 2021-10-17 NOTE — Progress Notes (Signed)
Called pt to schedule fu appt, unable to leave vm. ?

## 2021-10-17 NOTE — Progress Notes (Signed)
? ?There were no vitals taken for this visit.  ? ?Subjective:  ? ? Patient ID: Robert Lane, male    DOB: 06/18/2001, 21 y.o.   MRN: [id number] ? ?HPI: ?Robert Lane is a 21 y.o. male ? ?Chief Complaint  ?Patient presents with  ? ADHD  ?  Patient is here to follow up on ADHD. Patient denies having any concerns at today's visit.   ? ?ADHD FOLLOW UP ?Patient works on the weekends and feels like he needs the Vyvanse 30mg  on those days however on days he does not work he does not feel like he needs as high of a dose. Wondering if the dose can be lowered for those days. ?ADHD status: uncontrolled ?Satisfied with current therapy: yes ?Medication compliance:  excellent compliance ?Controlled substance contract: no ?Previous psychiatry evaluation:  unsure ?Previous medications: yes adderall   ?Taking meds on weekends/vacations: yes ?Work/school performance:  excellent ?Difficulty sustaining attention/completing tasks: no ?Distracted by extraneous stimuli:  sometimes ?Does not listen when spoken to: no  ?Fidgets with hands or feet: yes ?Unable to stay in seat: no ?Blurts out/interrupts others: no ?ADHD Medication Side Effects: no ?   Decreased appetite: no ?   Headache: no ?   Sleeping disturbance pattern: yes ?   Irritability: no ?   Rebound effects (worse than baseline) off medication: no ?   Anxiousness: no ?   Dizziness: no ?   Tics: no ? ? ? ?Relevant past medical, surgical, family and social history reviewed and updated as indicated. Interim medical history since our last visit reviewed. ?Allergies and medications reviewed and updated. ? ?Review of Systems  ?Neurological:  Negative for dizziness and headaches.  ?Psychiatric/Behavioral:  Positive for decreased concentration and sleep disturbance. Negative for agitation, behavioral problems and dysphoric mood. The patient is not nervous/anxious and is not hyperactive.   ? ?Per HPI unless specifically indicated above ? ?   ?Objective:  ?  ?There were no vitals taken for  this visit.  ?Wt Readings from Last 3 Encounters:  ?02/22/21 160 lb 2 oz (72.6 kg)  ?05/19/20 150 lb (68 kg)  ?04/24/20 145 lb (65.8 kg)  ?  ?Physical Exam ?Vitals and nursing note reviewed.  ?Constitutional:   ?   General: He is not in acute distress. ?   Appearance: He is not ill-appearing.  ?HENT:  ?   Head: Normocephalic.  ?   Right Ear: Hearing normal.  ?   Left Ear: Hearing normal.  ?   Nose: Nose normal.  ?Pulmonary:  ?   Effort: Pulmonary effort is normal. No respiratory distress.  ?Neurological:  ?   Mental Status: He is alert.  ?Psychiatric:     ?   Mood and Affect: Mood normal.     ?   Behavior: Behavior normal.     ?   Thought Content: Thought content normal.     ?   Judgment: Judgment normal.  ? ? ?Results for orders placed or performed in visit on 02/22/21  ?[id number] 11+Oxyco+Alc+Crt-Bund  ?Result Value Ref Range  ? Ethanol Negative Cutoff=0.020 %  ? Amphetamines, Urine See Final Results Cutoff=1000 ng/mL  ? Barbiturate Negative Cutoff=200 ng/mL  ? BENZODIAZ UR QL Negative Cutoff=200 ng/mL  ? Cannabinoid Quant, Ur See Final Results Cutoff=50 ng/mL  ? Cocaine (Metabolite) Negative Cutoff=300 ng/mL  ? OPIATE SCREEN URINE Negative Cutoff=300 ng/mL  ? Oxycodone/Oxymorphone, Urine Negative Cutoff=300 ng/mL  ? Phencyclidine Negative Cutoff=25 ng/mL  ? Methadone Screen, Urine Negative Cutoff=300 ng/mL  ?  Propoxyphene Negative Cutoff=300 ng/mL  ? Meperidine Negative Cutoff=200 ng/mL  ? Tramadol Negative Cutoff=200 ng/mL  ? Creatinine 198.2 20.0 - 300.0 mg/dL  ? pH, Urine 5.6 4.5 - 8.9  ?Drug Profile [phone]  ?Result Value Ref Range  ? Amphetamines Positive (A) Cutoff=1000  ? Amphetamine Positive (A)   ? Amphetamine GC/MS Conf >3000 Cutoff=500 ng/mL  ? Methamphetamine Negative Cutoff=500  ?Cannabinoid Conf, Ur  ?Result Value Ref Range  ? CANNABINOIDS Positive (A) Cutoff=50  ? Carboxy THC GC/MS Conf 35 Cutoff=15 ng/mL  ? ?   ?Assessment & Plan:  ? ?Problem List Items Addressed This Visit   ? ?  ? Other  ? ADHD -  Primary  ?  Chronic. Ongoing.  Patient would like to lower dose on days he does not work.  Will send Vyvanse 20mg  for 16 tabs monthly and Vyvanse 30mg  for 14 tabs.  Patient works 3 days weekly and will take Vyvanse 30mg  at that time.  Refills sent for 3 months.  Next appt needs to be in office.  Follow up in 3 months. ?  ?  ?  ? ?Follow up plan: ?Return in about 3 months (around 01/17/2022) for ADHD FU (in office). ? ? ? ?This visit was completed via MyChart due to the restrictions of the COVID-19 pandemic. All issues as above were discussed and addressed. Physical exam was done as above through visual confirmation on MyChart. If it was felt that the patient should be evaluated in the office, they were directed there. The patient verbally consented to this visit. ?Location of the patient: Home ?Location of the provider: Office ?Those involved with this call:  ?Provider: Jon Billings, NP ?CMA: Irena Reichmann, CMA ?Front Desk/Registration: [employer] ?This encounter was conducted via video.  I spent 30 dedicated to the care of this patient on the date of this encounter to include previsit review of symptoms, plan of care, and follow up instructions, face to face time with the patient, and post visit ordering of testing.  ? ? ? ? ? ?

## 2021-10-17 NOTE — Assessment & Plan Note (Signed)
Chronic. Ongoing.  Patient would like to lower dose on days he does not work.  Will send Vyvanse 20mg  for 16 tabs monthly and Vyvanse 30mg  for 14 tabs.  Patient works 3 days weekly and will take Vyvanse 30mg  at that time.  Refills sent for 3 months.  Next appt needs to be in office.  Follow up in 3 months. ?

## 2021-10-19 NOTE — Progress Notes (Signed)
Appt scheduled

## 2021-12-18 ENCOUNTER — Telehealth: Payer: Self-pay | Admitting: Nurse Practitioner

## 2021-12-18 NOTE — Telephone Encounter (Signed)
Medication Refill - Medication: lisdexamfetamine (VYVANSE) 20 MG capsule lisdexamfetamine (VYVANSE) 30 MG capsule  Has the patient contacted their pharmacy? No.  States the pharmacy makes her call every month. Patient will be out of medication tomorrow. (Agent: If no, request that the patient contact the pharmacy for the refill. If patient does not wish to contact the pharmacy document the reason why and proceed with request.) (Agent: If yes, when and what did the pharmacy advise?)  Preferred Pharmacy (with phone number or street name): [facility]     [place] [place], [place] Phone:  [phone]  Fax:  [phone]     Has the patient been seen for an appointment in the last year OR does the patient have an upcoming appointment? Yes.    Agent: Please be advised that RX refills may take up to 3 business days. We ask that you follow-up with your pharmacy.

## 2022-01-16 ENCOUNTER — Ambulatory Visit: Payer: Medicaid Other | Admitting: Nurse Practitioner

## 2022-01-23 ENCOUNTER — Other Ambulatory Visit: Payer: Self-pay | Admitting: Nurse Practitioner

## 2022-01-23 NOTE — Telephone Encounter (Signed)
Medication Refill - Medication: lisdexamfetamine (VYVANSE) 20 MG capsule and lisdexamfetamine (VYVANSE) 30 MG capsule  Has the patient contacted their pharmacy? Yes.   Pt told to contact provider  Preferred Pharmacy (with phone number or street name):  [id number] - [place], [place] - [address] AT [facility] [address] [facility] Phone:  [phone]  Fax:  [phone]     Has the patient been seen for an appointment in the last year OR does the patient have an upcoming appointment? Yes.    Agent: Please be advised that RX refills may take up to 3 business days. We ask that you follow-up with your pharmacy.

## 2022-01-31 ENCOUNTER — Ambulatory Visit: Payer: Medicaid Other | Admitting: Nurse Practitioner

## 2022-01-31 ENCOUNTER — Telehealth: Payer: Self-pay

## 2022-01-31 NOTE — Telephone Encounter (Signed)
Patient's mother notified.

## 2022-01-31 NOTE — Telephone Encounter (Signed)
Copied from CRM [id number]. Topic: Appointment Scheduling - Scheduling Inquiry for Clinic >> Jan 31, 2022 10:45 AM Reeves Forth wrote: If possible, can Robert Lane call back since she has to call the mother back.  Can this patient be virutal.   Routing to provider to advise if appointment can be virtual.

## 2022-01-31 NOTE — Telephone Encounter (Signed)
Patient was told it needed to be in person at last visit.

## 2022-02-01 ENCOUNTER — Ambulatory Visit (INDEPENDENT_AMBULATORY_CARE_PROVIDER_SITE_OTHER): Payer: Medicaid Other | Admitting: Nurse Practitioner

## 2022-02-01 ENCOUNTER — Encounter: Payer: Self-pay | Admitting: Nurse Practitioner

## 2022-02-01 VITALS — BP 116/78 | HR 60 | Temp 98.4°F | Wt 171.8 lb

## 2022-02-01 DIAGNOSIS — F909 Attention-deficit hyperactivity disorder, unspecified type: Secondary | ICD-10-CM | POA: Diagnosis not present

## 2022-02-01 MED ORDER — LISDEXAMFETAMINE DIMESYLATE 10 MG PO CAPS: 10.0000 mg | ORAL_CAPSULE | Freq: Every day | ORAL | 0 refills | Status: DC

## 2022-02-01 NOTE — Assessment & Plan Note (Signed)
Chronic. Patient would like to decrease dose of Vyvanse to 10mg  daily.  Feels like this will be enough to help controlled his symptoms.  Denies other concerns at visit today.  Refills sent.  UDS and controlled substance agreement updated at visit today.  Follow up in 3 months.  Call sooner if concerns arise.

## 2022-02-01 NOTE — Progress Notes (Signed)
BP 116/78   Pulse 60   Temp 98.4 F (36.9 C) (Oral)   Wt 171 lb 12.8 oz (77.9 kg)   SpO2 98%   BMI 24.76 kg/m    Subjective:    Patient ID: Robert Lane, male    DOB: 2001-03-23, 21 y.o.   MRN: [id number]  HPI: Robert Lane is a 21 y.o. male  Chief Complaint  Patient presents with   ADHD    3 month follow up - pt states would like to decrease his vyvanse to 10 mg.    ADHD FOLLOW UP Patient would like to change his dose to 10mg  everyday.   ADHD status: uncontrolled Satisfied with current therapy: yes Medication compliance:  excellent compliance Controlled substance contract: no Previous psychiatry evaluation:  unsure Previous medications: yes adderall   Taking meds on weekends/vacations: yes Work/school performance:  excellent Difficulty sustaining attention/completing tasks: no Distracted by extraneous stimuli:  sometimes Does not listen when spoken to: no  Fidgets with hands or feet: yes Unable to stay in seat: no Blurts out/interrupts others: no ADHD Medication Side Effects: no    Decreased appetite: no    Headache: no    Sleeping disturbance pattern: yes    Irritability: no    Rebound effects (worse than baseline) off medication: no    Anxiousness: no    Dizziness: no    Tics: no    Relevant past medical, surgical, family and social history reviewed and updated as indicated. Interim medical history since our last visit reviewed. Allergies and medications reviewed and updated.  Review of Systems  Neurological:  Negative for dizziness and headaches.  Psychiatric/Behavioral:  Positive for decreased concentration and sleep disturbance. Negative for agitation, behavioral problems and dysphoric mood. The patient is not nervous/anxious and is not hyperactive.     Per HPI unless specifically indicated above     Objective:    BP 116/78   Pulse 60   Temp 98.4 F (36.9 C) (Oral)   Wt 171 lb 12.8 oz (77.9 kg)   SpO2 98%   BMI 24.76 kg/m   Wt Readings  from Last 3 Encounters:  02/01/22 171 lb 12.8 oz (77.9 kg)  02/22/21 160 lb 2 oz (72.6 kg)  05/19/20 150 lb (68 kg)    Physical Exam Vitals and nursing note reviewed.  Constitutional:      General: He is not in acute distress.    Appearance: Normal appearance. He is not ill-appearing, toxic-appearing or diaphoretic.  HENT:     Head: Normocephalic.     Right Ear: External ear normal.     Left Ear: External ear normal.     Nose: Nose normal. No congestion or rhinorrhea.     Mouth/Throat:     Mouth: Mucous membranes are moist.  Eyes:     General:        Right eye: No discharge.        Left eye: No discharge.     Extraocular Movements: Extraocular movements intact.     Conjunctiva/sclera: Conjunctivae normal.     Pupils: Pupils are equal, round, and reactive to light.  Cardiovascular:     Rate and Rhythm: Normal rate and regular rhythm.     Heart sounds: No murmur heard. Pulmonary:     Effort: Pulmonary effort is normal. No respiratory distress.     Breath sounds: Normal breath sounds. No wheezing, rhonchi or rales.  Abdominal:     General: Abdomen is flat. Bowel sounds are normal.  Musculoskeletal:  Cervical back: Normal range of motion and neck supple.  Skin:    General: Skin is warm and dry.     Capillary Refill: Capillary refill takes less than 2 seconds.  Neurological:     General: No focal deficit present.     Mental Status: He is alert and oriented to person, place, and time.  Psychiatric:        Mood and Affect: Mood normal.        Behavior: Behavior normal.        Thought Content: Thought content normal.        Judgment: Judgment normal.     Results for orders placed or performed in visit on 02/22/21  [id number] 11+Oxyco+Alc+Crt-Bund  Result Value Ref Range   Ethanol Negative Cutoff=0.020 %   Amphetamines, Urine See Final Results Cutoff=1000 ng/mL   Barbiturate Negative Cutoff=200 ng/mL   BENZODIAZ UR QL Negative Cutoff=200 ng/mL   Cannabinoid Quant, Ur See  Final Results Cutoff=50 ng/mL   Cocaine (Metabolite) Negative Cutoff=300 ng/mL   OPIATE SCREEN URINE Negative Cutoff=300 ng/mL   Oxycodone/Oxymorphone, Urine Negative Cutoff=300 ng/mL   Phencyclidine Negative Cutoff=25 ng/mL   Methadone Screen, Urine Negative Cutoff=300 ng/mL   Propoxyphene Negative Cutoff=300 ng/mL   Meperidine Negative Cutoff=200 ng/mL   Tramadol Negative Cutoff=200 ng/mL   Creatinine 198.2 20.0 - 300.0 mg/dL   pH, Urine 5.6 4.5 - 8.9  Drug Profile [phone]  Result Value Ref Range   Amphetamines Positive (A) Cutoff=1000   Amphetamine Positive (A)    Amphetamine GC/MS Conf >3000 Cutoff=500 ng/mL   Methamphetamine Negative Cutoff=500  Cannabinoid Conf, Ur  Result Value Ref Range   CANNABINOIDS Positive (A) Cutoff=50   Carboxy THC GC/MS Conf 35 Cutoff=15 ng/mL      Assessment & Plan:   Problem List Items Addressed This Visit       Other   ADHD - Primary    Chronic. Patient would like to decrease dose of Vyvanse to 10mg  daily.  Feels like this will be enough to help controlled his symptoms.  Denies other concerns at visit today.  Refills sent.  UDS and controlled substance agreement updated at visit today.  Follow up in 3 months.  Call sooner if concerns arise.       Relevant Orders   11+Oxyco+Alc+Crt-Bund     Follow up plan: Return in about 3 months (around 05/04/2022) for ADHD FU (virtual okay).

## 2022-02-02 LAB — DRUG SCREEN 764883 11+OXYCO+ALC+CRT-BUND
Amphetamines, Urine: NEGATIVE ng/mL
BENZODIAZ UR QL: NEGATIVE ng/mL
Barbiturate: NEGATIVE ng/mL
Cannabinoid Quant, Ur: NEGATIVE ng/mL
Cocaine (Metabolite): NEGATIVE ng/mL
Creatinine: 123.9 mg/dL (ref 20.0–300.0)
Ethanol: NEGATIVE %
Meperidine: NEGATIVE ng/mL
Methadone Screen, Urine: NEGATIVE ng/mL
OPIATE SCREEN URINE: NEGATIVE ng/mL
Oxycodone/Oxymorphone, Urine: NEGATIVE ng/mL
Phencyclidine: NEGATIVE ng/mL
Propoxyphene: NEGATIVE ng/mL
Tramadol: NEGATIVE ng/mL
pH, Urine: 6.3 (ref 4.5–8.9)

## 2022-02-05 NOTE — Progress Notes (Signed)
Good Morning. Your drug screen looks good.  No concerns at this time.  Follow up as discussed.

## 2022-02-12 ENCOUNTER — Ambulatory Visit: Payer: Medicaid Other | Admitting: Nurse Practitioner

## 2022-05-03 NOTE — Progress Notes (Signed)
There were no vitals taken for this visit.   Subjective:    Patient ID: Robert Lane, male    DOB: 2000/08/03, 21 y.o.   MRN: [id number]  HPI: Robert Lane is a 21 y.o. male  Chief Complaint  Patient presents with   ADHD   ADHD FOLLOW UP Patient would like to change his dose to 10mg  everyday during the week when he isn't working.  Then he takes Vyvanses 30mg  on the weekends when he is at work.  Feels like these doses work well for him.  ADHD status: uncontrolled Satisfied with current therapy: yes Medication compliance:  excellent compliance Controlled substance contract: no Previous psychiatry evaluation:  unsure Previous medications: yes adderall   Taking meds on weekends/vacations: yes Work/school performance:  excellent Difficulty sustaining attention/completing tasks: no Distracted by extraneous stimuli:  sometimes Does not listen when spoken to: no  Fidgets with hands or feet: yes Unable to stay in seat: no Blurts out/interrupts others: no ADHD Medication Side Effects: no    Decreased appetite: no    Headache: no    Sleeping disturbance pattern: yes    Irritability: no    Rebound effects (worse than baseline) off medication: no    Anxiousness: no    Dizziness: no    Tics: no    Relevant past medical, surgical, family and social history reviewed and updated as indicated. Interim medical history since our last visit reviewed. Allergies and medications reviewed and updated.  Review of Systems  Neurological:  Negative for dizziness and headaches.  Psychiatric/Behavioral:  Positive for decreased concentration and sleep disturbance. Negative for agitation, behavioral problems and dysphoric mood. The patient is not nervous/anxious and is not hyperactive.     Per HPI unless specifically indicated above     Objective:    There were no vitals taken for this visit.  Wt Readings from Last 3 Encounters:  02/01/22 171 lb 12.8 oz (77.9 kg)  02/22/21 160 lb 2 oz  (72.6 kg)  05/19/20 150 lb (68 kg)    Physical Exam Vitals and nursing note reviewed.  Constitutional:      General: He is not in acute distress.    Appearance: He is not ill-appearing.  HENT:     Head: Normocephalic.     Right Ear: Hearing normal.     Left Ear: Hearing normal.     Nose: Nose normal.  Pulmonary:     Effort: Pulmonary effort is normal. No respiratory distress.  Neurological:     Mental Status: He is alert.  Psychiatric:        Mood and Affect: Mood normal.        Behavior: Behavior normal.        Thought Content: Thought content normal.        Judgment: Judgment normal.     Results for orders placed or performed in visit on 02/01/22  [id number] 11+Oxyco+Alc+Crt-Bund  Result Value Ref Range   Ethanol Negative Cutoff=0.020 %   Amphetamines, Urine Negative Cutoff=1000 ng/mL   Barbiturate Negative Cutoff=200 ng/mL   BENZODIAZ UR QL Negative Cutoff=200 ng/mL   Cannabinoid Quant, Ur Negative Cutoff=50 ng/mL   Cocaine (Metabolite) Negative Cutoff=300 ng/mL   OPIATE SCREEN URINE Negative Cutoff=300 ng/mL   Oxycodone/Oxymorphone, Urine Negative Cutoff=300 ng/mL   Phencyclidine Negative Cutoff=25 ng/mL   Methadone Screen, Urine Negative Cutoff=300 ng/mL   Propoxyphene Negative Cutoff=300 ng/mL   Meperidine Negative Cutoff=200 ng/mL   Tramadol Negative Cutoff=200 ng/mL   Creatinine 123.9 20.0 - 300.0 mg/dL  pH, Urine 6.3 4.5 - 8.9      Assessment & Plan:   Problem List Items Addressed This Visit       Other   ADHD - Primary    Chronic. Patient would like to decrease dose of Vyvanse to 10mg  daily.  Except on the weekends he takes Vyvanse 30mg  when he is working.  Feels like this will be enough to help controlled his symptoms.  Denies other concerns at visit today.  Refills sent.  UDS and controlled substance agreement updated at visit today.  Follow up in 3 months.  Call sooner if concerns arise.         Follow up plan: Return in about 3 months (around  08/04/2022) for Physical and Fasting labs.   This visit was completed via MyChart due to the restrictions of the COVID-19 pandemic. All issues as above were discussed and addressed. Physical exam was done as above through visual confirmation on MyChart. If it was felt that the patient should be evaluated in the office, they were directed there. The patient verbally consented to this visit. Location of the patient: Home Location of the provider: Office Those involved with this call:  Provider: , NP CMA: 10/03/2022, CMA Front Desk/Registration: Larae Grooms This encounter was conducted via video.  I spent 20 dedicated to the care of this patient on the date of this encounter to include previsit review of medications, refills, follow up, face to face time with the patient, and post visit ordering of testing.

## 2022-05-04 ENCOUNTER — Telehealth (INDEPENDENT_AMBULATORY_CARE_PROVIDER_SITE_OTHER): Admitting: Nurse Practitioner

## 2022-05-04 ENCOUNTER — Encounter: Payer: Self-pay | Admitting: Nurse Practitioner

## 2022-05-04 DIAGNOSIS — F909 Attention-deficit hyperactivity disorder, unspecified type: Secondary | ICD-10-CM

## 2022-05-04 MED ORDER — LISDEXAMFETAMINE DIMESYLATE 10 MG PO CAPS: 10.0000 mg | ORAL_CAPSULE | Freq: Every day | ORAL | 0 refills | Status: DC

## 2022-05-04 MED ORDER — LISDEXAMFETAMINE DIMESYLATE 10 MG PO CAPS
10.0000 mg | ORAL_CAPSULE | Freq: Every day | ORAL | 0 refills | Status: DC
Start: 2022-05-22 — End: 2022-08-30

## 2022-05-04 MED ORDER — LISDEXAMFETAMINE DIMESYLATE 30 MG PO CAPS: 30.0000 mg | ORAL_CAPSULE | Freq: Every day | ORAL | 0 refills | Status: DC

## 2022-05-04 NOTE — Assessment & Plan Note (Signed)
Chronic. Patient would like to decrease dose of Vyvanse to 10mg  daily.  Except on the weekends he takes Vyvanse 30mg  when he is working.  Feels like this will be enough to help controlled his symptoms.  Denies other concerns at visit today.  Refills sent.  UDS and controlled substance agreement updated at visit today.  Follow up in 3 months.  Call sooner if concerns arise.

## 2022-06-03 IMAGING — US US SOFT TISSUE HEAD/NECK
1 series · 13 of 13 positions shown · non-contrast
Comparison: None.

CLINICAL DATA: Left neck palpable abnormality

EXAM:
ULTRASOUND OF HEAD/NECK SOFT TISSUES
TECHNIQUE: Ultrasound examination of the head and neck soft tissues was
performed in the area of clinical concern.

[Series 1: us soft tissue head & neck (non-thyroid) · 13 of 13 slices shown]
[im 1/13]
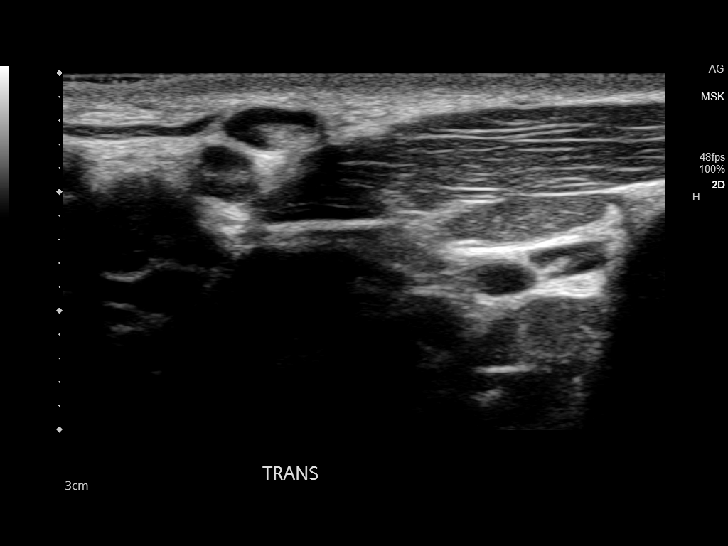
[im 2/13]
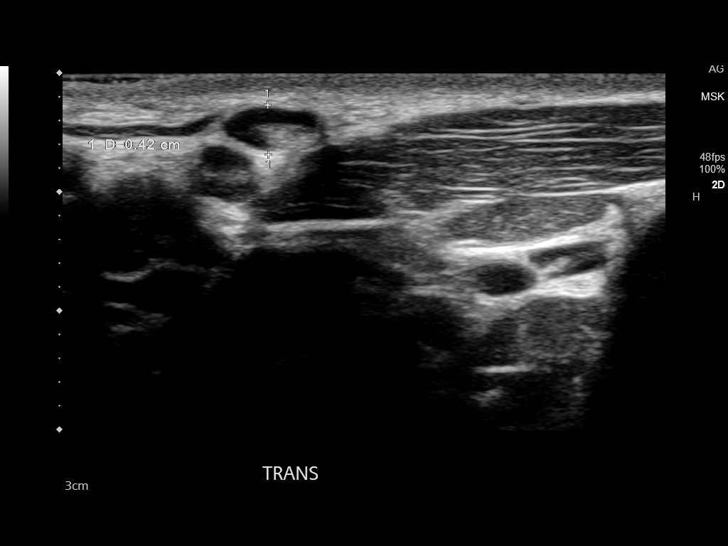
[im 3/13]
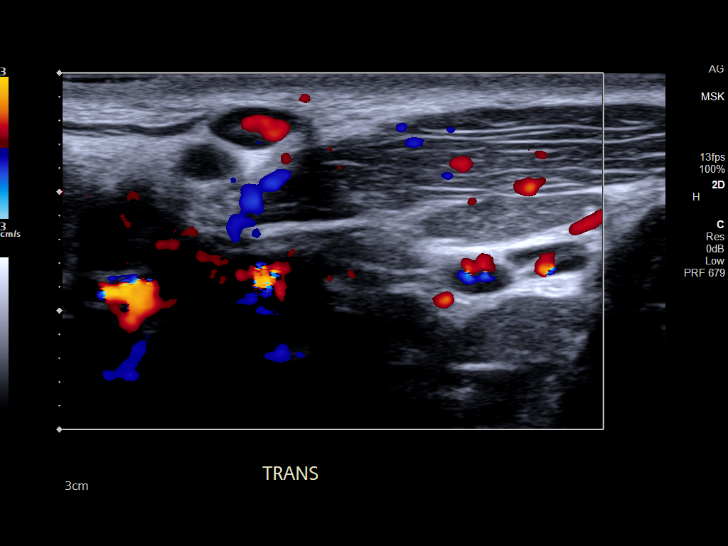
[im 4/13]
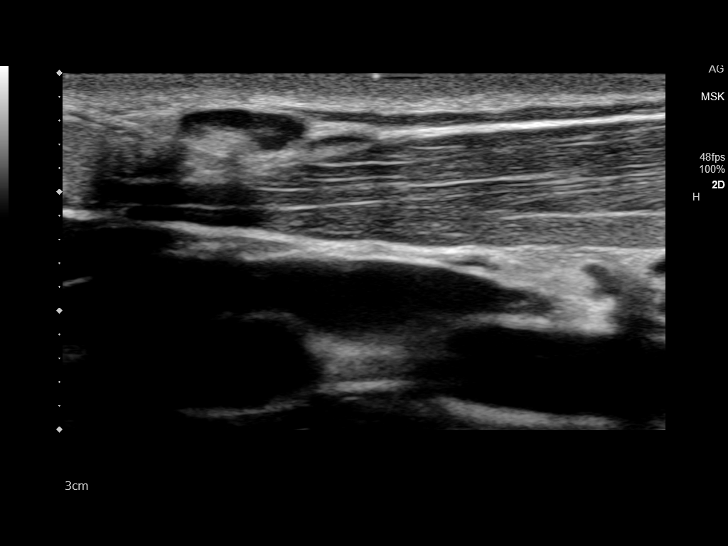
[im 5/13]
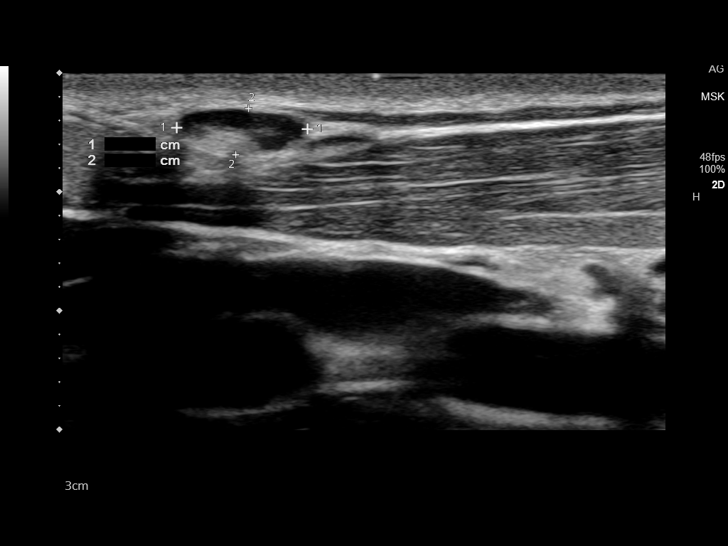
[im 6/13]
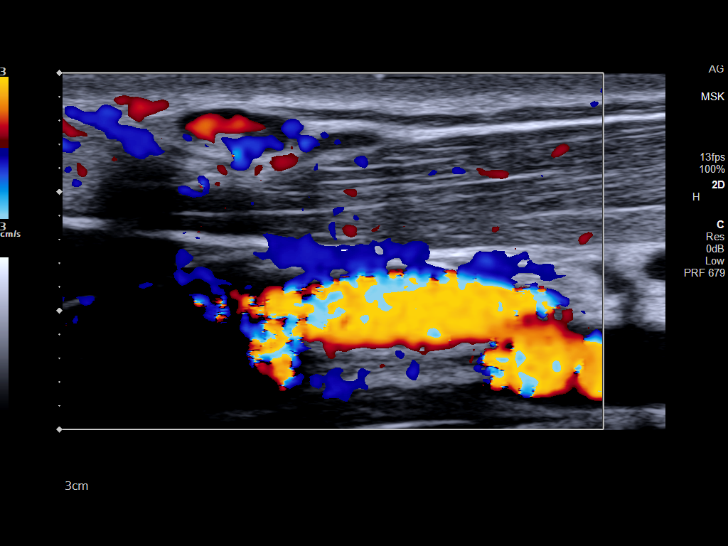
[im 7/13]
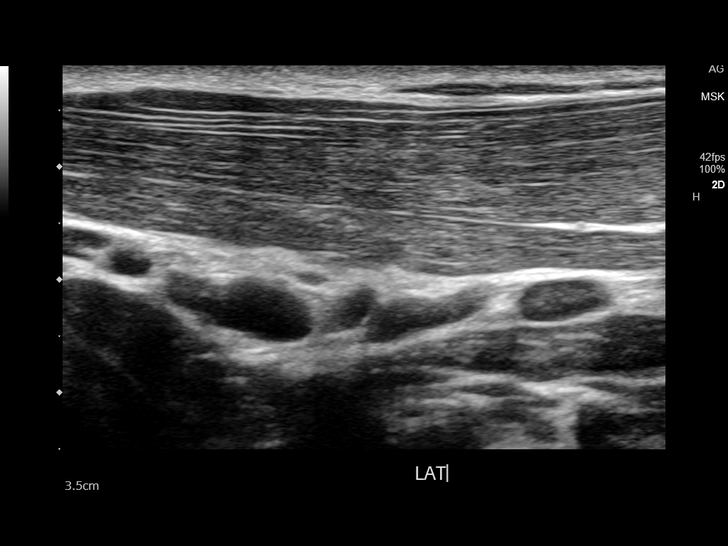
[im 8/13]
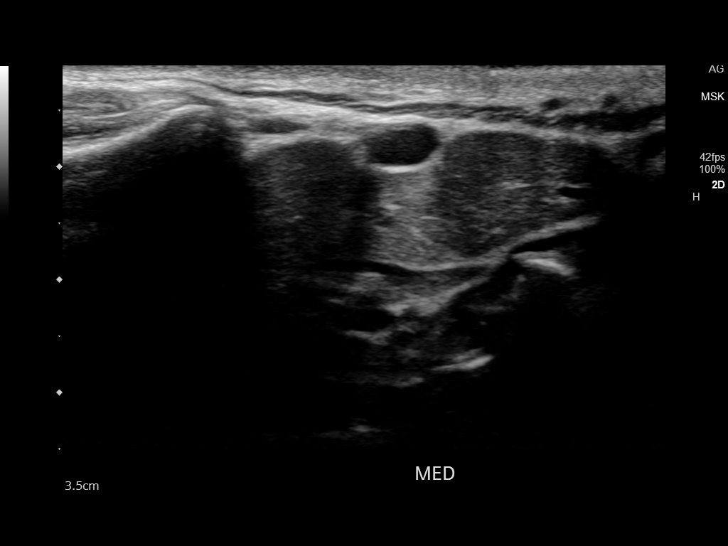
[im 9/13]
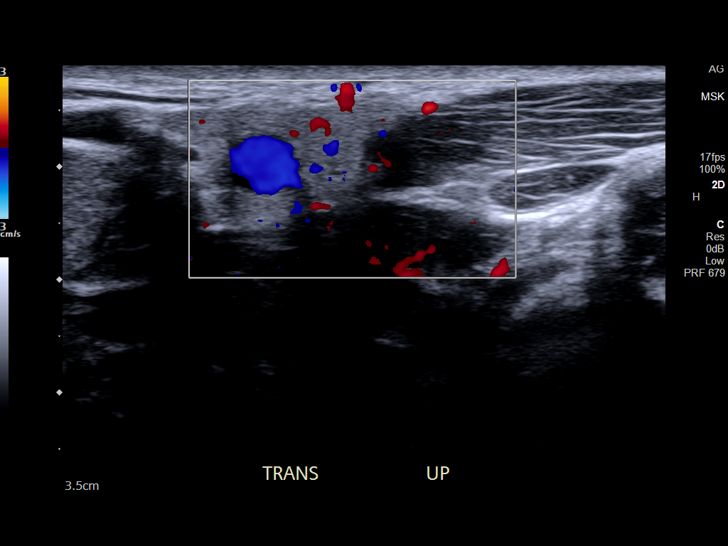
[im 10/13]
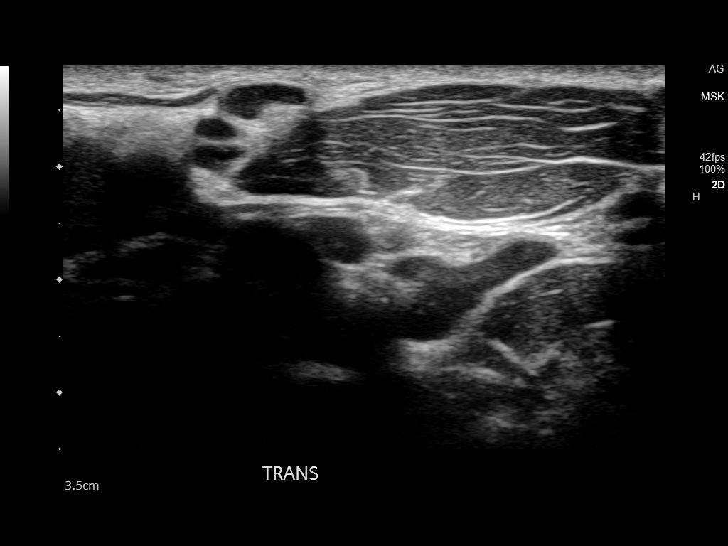
[im 11/13]
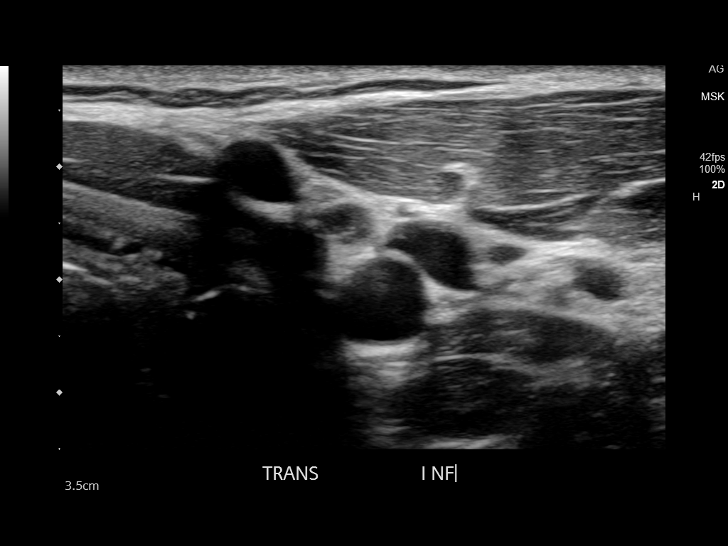
[im 12/13]
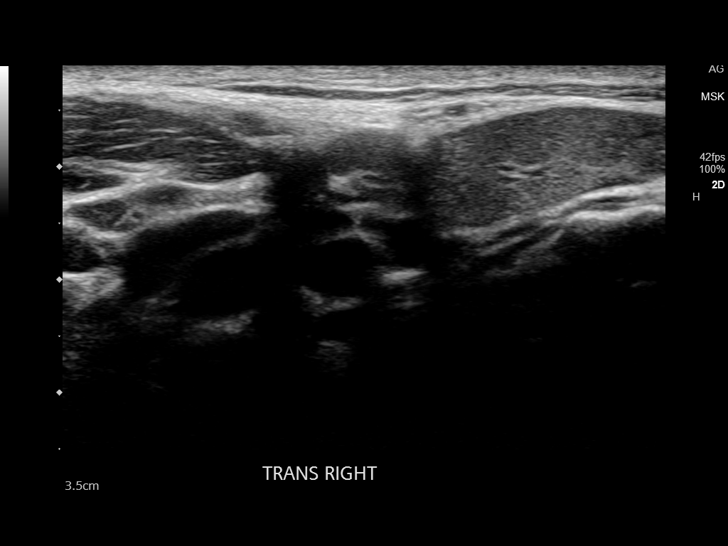
[im 13/13]
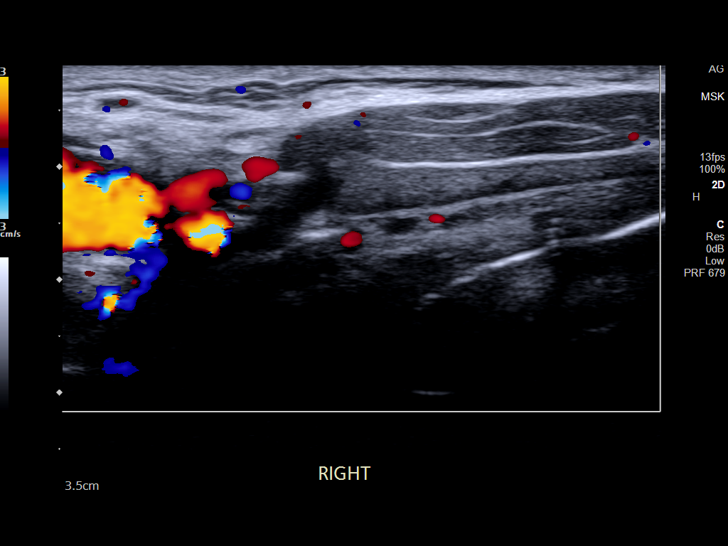

[13 of 13 positions shown; findings below may reference images not displayed]

FINDINGS: Focused sonographic exam in the left neck was performed in the area
of interest. There is a normal appearing lymph node which measures
up to 4 mm in cortical thickness with preserved fatty hilum.
IMPRESSION: Normal lymph node in the left neck.

## 2022-06-08 ENCOUNTER — Telehealth: Payer: Self-pay | Admitting: Nurse Practitioner

## 2022-06-08 NOTE — Telephone Encounter (Signed)
Requested medication (s) are due for refill today: due 06/12/22  Requested medication (s) are on the active medication list: yes  Last refill:  05/22/22 #30 0 refills  Future visit scheduled: yes in 1 month  Notes to clinic:  patient requesting refill. Not delegated per protocol. Do you want to refill Rx?     Requested Prescriptions  Pending Prescriptions Disp Refills   lisdexamfetamine (VYVANSE) 30 MG capsule 15 capsule 0    Sig: Take 1 capsule (30 mg total) by mouth daily.     Not Delegated - Psychiatry:  Stimulants/ADHD Failed - 06/08/2022  3:22 PM      Failed - This refill cannot be delegated      Passed - Urine Drug Screen completed in last 360 days      Passed - Last BP in normal range    BP Readings from Last 1 Encounters:  02/01/22 116/78         Passed - Last Heart Rate in normal range    Pulse Readings from Last 1 Encounters:  02/01/22 60         Passed - Valid encounter within last 6 months    Recent Outpatient Visits           1 month ago Attention deficit hyperactivity disorder (ADHD), unspecified ADHD type   [facility] Larae Grooms, NP   4 months ago Attention deficit hyperactivity disorder (ADHD), unspecified ADHD type   [facility] Larae Grooms, NP   7 months ago Attention deficit hyperactivity disorder (ADHD), unspecified ADHD type   [facility] Larae Grooms, NP   10 months ago Attention deficit hyperactivity disorder (ADHD), unspecified ADHD type   [facility] Larae Grooms, NP   12 months ago Attention deficit hyperactivity disorder (ADHD), unspecified ADHD type   [facility] Larae Grooms, NP       Future Appointments             In 1 month Larae Grooms, NP [facility], [facility]

## 2022-06-08 NOTE — Telephone Encounter (Signed)
Medication Refill - Medication: lisdexamfetamine (VYVANSE) 30 MG capsule [[id number]]   Has the patient contacted their pharmacy? Yes.   (Agent: If no, request that the patient contact the pharmacy for the refill. If patient does not wish to contact the pharmacy document the reason why and proceed with request.) (Agent: If yes, when and what did the pharmacy advise?)  Preferred Pharmacy (with phone number or street name): [facility] #[id number] Cheree Ditto, [place] - [address] AT [facility] [address] [facility] [address] [place], [place] [address] Phone: [phone]  Fax: [phone] DEA #: [id number]  Has the patient been seen for an appointment in the last year OR does the patient have an upcoming appointment? Yes.    Agent: Please be advised that RX refills may take up to 3 business days. We ask that you follow-up with your pharmacy.

## 2022-06-11 MED ORDER — LISDEXAMFETAMINE DIMESYLATE 30 MG PO CAPS: 30.0000 mg | ORAL_CAPSULE | Freq: Every day | ORAL | 0 refills | Status: DC

## 2022-06-11 NOTE — Telephone Encounter (Signed)
Pt is stating that the pharmacy states this is not covered by Medicare. Pt states it is b/c it is   Lisdexamfetamine but looks as that is what has been getting and she feels it is due to what the dr called in not the insurance. FU with Mother Juliette Alcide  [phone]

## 2022-06-13 NOTE — Telephone Encounter (Signed)
LVMTRC for medication clarification.

## 2022-06-13 NOTE — Telephone Encounter (Signed)
Yes we will have to do the PA for the 15mg  also.  However, he was able to pick up both doses recently.  May want to check with the pharmacy and clarify since the message was very poorly written and patient does not have Medicare.

## 2022-07-20 ENCOUNTER — Other Ambulatory Visit: Payer: Self-pay | Admitting: Nurse Practitioner

## 2022-07-20 NOTE — Telephone Encounter (Signed)
Medication Refill - Medication:  lisdexamfetamine (VYVANSE) 30 MG capsule  Has the patient contacted their pharmacy? Yes.   (Agent: If no, request that the patient contact the pharmacy for the refill. If patient does not wish to contact the pharmacy document the reason why and proceed with request.) (Agent: If yes, when and what did the pharmacy advise?)  Preferred Pharmacy (with phone number or street name):   [facility] #[id number] Cheree Ditto, [place] - [address] AT [facility] OF [address] [facility]  [address] [place] [address]  Phone: [phone] Fax: [phone]   Has the patient been seen for an appointment in the last year OR does the patient have an upcoming appointment? Yes.    Agent: Please be advised that RX refills may take up to 3 business days. We ask that you follow-up with your pharmacy.

## 2022-07-24 MED ORDER — LISDEXAMFETAMINE DIMESYLATE 30 MG PO CAPS: 30.0000 mg | ORAL_CAPSULE | Freq: Every day | ORAL | 0 refills | Status: DC

## 2022-07-24 NOTE — Telephone Encounter (Signed)
Requested medication (s) are due for refill today: Yes  Requested medication (s) are on the active medication list: Yes  Last refill:  06/11/22  Future visit scheduled: Yes  Notes to clinic:  See request.    Requested Prescriptions  Pending Prescriptions Disp Refills   lisdexamfetamine (VYVANSE) 30 MG capsule 15 capsule 0    Sig: Take 1 capsule (30 mg total) by mouth daily.     Not Delegated - Psychiatry:  Stimulants/ADHD Failed - 07/20/2022  5:19 PM      Failed - This refill cannot be delegated      Passed - Urine Drug Screen completed in last 360 days      Passed - Last BP in normal range    BP Readings from Last 1 Encounters:  02/01/22 116/78         Passed - Last Heart Rate in normal range    Pulse Readings from Last 1 Encounters:  02/01/22 60         Passed - Valid encounter within last 6 months    Recent Outpatient Visits           2 months ago Attention deficit hyperactivity disorder (ADHD), unspecified ADHD type   [facility] Larae Grooms, NP   5 months ago Attention deficit hyperactivity disorder (ADHD), unspecified ADHD type   [facility] Larae Grooms, NP   9 months ago Attention deficit hyperactivity disorder (ADHD), unspecified ADHD type   [facility] Larae Grooms, NP   1 year ago Attention deficit hyperactivity disorder (ADHD), unspecified ADHD type   [facility] Larae Grooms, NP   1 year ago Attention deficit hyperactivity disorder (ADHD), unspecified ADHD type   [facility] Larae Grooms, NP       Future Appointments             In 1 week Larae Grooms, NP [facility], [facility]

## 2022-08-06 ENCOUNTER — Ambulatory Visit: Admitting: Nurse Practitioner

## 2022-08-06 NOTE — Progress Notes (Deleted)
There were no vitals taken for this visit.   Subjective:    Patient ID: Robert Lane, male    DOB: 2001-02-08, 22 y.o.   MRN: [id number]  HPI: Robert Lane is a 22 y.o. male  No chief complaint on file.  ADHD FOLLOW UP Patient would like to change his dose to 10mg  everyday during the week when he isn't working.  Then he takes Vyvanses 30mg  on the weekends when he is at work.  Feels like these doses work well for him.  ADHD status: uncontrolled Satisfied with current therapy: yes Medication compliance:  excellent compliance Controlled substance contract: no Previous psychiatry evaluation:  unsure Previous medications: yes adderall   Taking meds on weekends/vacations: yes Work/school performance:  excellent Difficulty sustaining attention/completing tasks: no Distracted by extraneous stimuli:  sometimes Does not listen when spoken to: no  Fidgets with hands or feet: yes Unable to stay in seat: no Blurts out/interrupts others: no ADHD Medication Side Effects: no    Decreased appetite: no    Headache: no    Sleeping disturbance pattern: yes    Irritability: no    Rebound effects (worse than baseline) off medication: no    Anxiousness: no    Dizziness: no    Tics: no    Relevant past medical, surgical, family and social history reviewed and updated as indicated. Interim medical history since our last visit reviewed. Allergies and medications reviewed and updated.  Review of Systems  Neurological:  Negative for dizziness and headaches.  Psychiatric/Behavioral:  Positive for decreased concentration and sleep disturbance. Negative for agitation, behavioral problems and dysphoric mood. The patient is not nervous/anxious and is not hyperactive.     Per HPI unless specifically indicated above     Objective:    There were no vitals taken for this visit.  Wt Readings from Last 3 Encounters:  02/01/22 171 lb 12.8 oz (77.9 kg)  02/22/21 160 lb 2 oz (72.6 kg)  05/19/20 150  lb (68 kg)    Physical Exam Vitals and nursing note reviewed.  Constitutional:      General: He is not in acute distress.    Appearance: He is not ill-appearing.  HENT:     Head: Normocephalic.     Right Ear: Hearing normal.     Left Ear: Hearing normal.     Nose: Nose normal.  Pulmonary:     Effort: Pulmonary effort is normal. No respiratory distress.  Neurological:     Mental Status: He is alert.  Psychiatric:        Mood and Affect: Mood normal.        Behavior: Behavior normal.        Thought Content: Thought content normal.        Judgment: Judgment normal.    Results for orders placed or performed in visit on 02/01/22  05/21/20 11+Oxyco+Alc+Crt-Bund  Result Value Ref Range   Ethanol Negative Cutoff=0.020 %   Amphetamines, Urine Negative Cutoff=1000 ng/mL   Barbiturate Negative Cutoff=200 ng/mL   BENZODIAZ UR QL Negative Cutoff=200 ng/mL   Cannabinoid Quant, Ur Negative Cutoff=50 ng/mL   Cocaine (Metabolite) Negative Cutoff=300 ng/mL   OPIATE SCREEN URINE Negative Cutoff=300 ng/mL   Oxycodone/Oxymorphone, Urine Negative Cutoff=300 ng/mL   Phencyclidine Negative Cutoff=25 ng/mL   Methadone Screen, Urine Negative Cutoff=300 ng/mL   Propoxyphene Negative Cutoff=300 ng/mL   Meperidine Negative Cutoff=200 ng/mL   Tramadol Negative Cutoff=200 ng/mL   Creatinine 123.9 20.0 - 300.0 mg/dL   pH, Urine 6.3 4.5 -  8.9      Assessment & Plan:   Problem List Items Addressed This Visit      Other   ADHD - Primary     Follow up plan: No follow-ups on file.   This visit was completed via MyChart due to the restrictions of the COVID-19 pandemic. All issues as above were discussed and addressed. Physical exam was done as above through visual confirmation on MyChart. If it was felt that the patient should be evaluated in the office, they were directed there. The patient verbally consented to this visit. Location of the patient: Home Location of the provider: Office Those  involved with this call:  Provider: Jon Billings, NP CMA: Valinda Hoar, [place] Desk/Registration: Lynnell Catalan This encounter was conducted via video.  I spent 20 dedicated to the care of this patient on the date of this encounter to include previsit review of medications, refills, follow up, face to face time with the patient, and post visit ordering of testing.

## 2022-08-10 ENCOUNTER — Ambulatory Visit
Admission: EM | Admit: 2022-08-10 | Discharge: 2022-08-10 | Disposition: A | Discharge status: Home / Self Care | Attending: Emergency Medicine | Admitting: Emergency Medicine

## 2022-08-10 ENCOUNTER — Encounter: Payer: Self-pay | Admitting: Emergency Medicine

## 2022-08-10 ENCOUNTER — Ambulatory Visit (INDEPENDENT_AMBULATORY_CARE_PROVIDER_SITE_OTHER)

## 2022-08-10 DIAGNOSIS — K5900 Constipation, unspecified: Secondary | ICD-10-CM

## 2022-08-10 DIAGNOSIS — R109 Unspecified abdominal pain: Secondary | ICD-10-CM | POA: Diagnosis not present

## 2022-08-10 NOTE — ED Provider Notes (Signed)
[facility]    CSN: [id number] Arrival date & time: 08/10/22  1643      History   Chief Complaint Chief Complaint  Patient presents with   Abdominal Pain   Diarrhea    HPI Robert Lane is a 22 y.o. male.   HPI  22 year old male here for evaluation of abdominal pain, bloating, and diarrhea.  The patient is here with his girlfriend for evaluation of the above symptoms that began 5 days ago.  He denies any fever, nausea, or vomiting.  He states that the pain is sort of diffusely throughout his abdomen.  He reports that when he eats, even if it is a little bit, he feels bloated.  His girlfriend thinks that it is diet related as the patient eats a lot of processed food and snack foods to include [employer].  Past Medical History:  Diagnosis Date   ADHD     Patient Active Problem List   Diagnosis Date Noted   Insomnia 05/13/2018   ADHD 04/19/2017    History reviewed. No pertinent surgical history.     Home Medications    Prior to Admission medications   Medication Sig Start Date End Date Taking? Authorizing Provider  lisdexamfetamine (VYVANSE) 10 MG capsule Take 1 capsule (10 mg total) by mouth daily. 05/22/22  Yes Larae Grooms, NP  lisdexamfetamine (VYVANSE) 30 MG capsule Take 1 capsule (30 mg total) by mouth daily. 07/24/22  Yes Larae Grooms, NP    Family History Family History  Problem Relation Age of Onset   Diabetes Mother    Hypertension Maternal Grandmother    Heart disease Maternal Grandfather    Hypertension Maternal Grandfather    Cancer Neg Hx     Social History Social History   Tobacco Use   Smoking status: Never   Smokeless tobacco: Never  Vaping Use   Vaping Use: Never used  Substance Use Topics   Alcohol use: No   Drug use: No     Allergies   Patient has no known allergies.   Review of Systems Review of Systems  Constitutional:  Negative for fever.  Gastrointestinal:  Positive for abdominal distention,  abdominal pain, constipation and diarrhea. Negative for blood in stool, nausea and vomiting.     Physical Exam Triage Vital Signs ED Triage Vitals  Enc Vitals Group     BP 08/10/22 1724 119/78     Pulse Rate 08/10/22 1724 60     Resp 08/10/22 1724 15     Temp 08/10/22 1724 98.3 F (36.8 C)     Temp Source 08/10/22 1724 Oral     SpO2 08/10/22 1724 100 %     Weight 08/10/22 1722 160 lb (72.6 kg)     Height 08/10/22 1722 5\' 9"  (1.753 m)     Head Circumference --      Peak Flow --      Pain Score 08/10/22 1722 6     Pain Loc --      Pain Edu? --      Excl. in GC? --    No data found.  Updated Vital Signs BP 119/78 (BP Location: Left Arm)   Pulse 60   Temp 98.3 F (36.8 C) (Oral)   Resp 15   Ht 5\' 9"  (1.753 m)   Wt 160 lb (72.6 kg)   SpO2 100%   BMI 23.63 kg/m   Visual Acuity Right Eye Distance:   Left Eye Distance:   Bilateral Distance:  Right Eye Near:   Left Eye Near:    Bilateral Near:     Physical Exam Vitals and nursing note reviewed.  Constitutional:      Appearance: Normal appearance. He is not ill-appearing.  HENT:     Head: Normocephalic and atraumatic.  Cardiovascular:     Rate and Rhythm: Normal rate and regular rhythm.     Pulses: Normal pulses.     Heart sounds: Normal heart sounds. No murmur heard.    No friction rub. No gallop.  Pulmonary:     Effort: Pulmonary effort is normal.     Breath sounds: Normal breath sounds. No wheezing, rhonchi or rales.  Abdominal:     General: Abdomen is flat. Bowel sounds are normal. There is no distension.     Tenderness: There is no abdominal tenderness. There is no guarding or rebound.  Skin:    General: Skin is warm and dry.     Capillary Refill: Capillary refill takes less than 2 seconds.  Neurological:     General: No focal deficit present.     Mental Status: He is alert and oriented to person, place, and time.  Psychiatric:        Mood and Affect: Mood normal.        Behavior: Behavior normal.         Thought Content: Thought content normal.        Judgment: Judgment normal.      UC Treatments / Results  Labs (all labs ordered are listed, but only abnormal results are displayed) Labs Reviewed - No data to display  EKG   Radiology DG Abd 2 Views  Result Date: 08/10/2022 CLINICAL DATA:  Abdominal pain EXAM: ABDOMEN - 2 VIEW COMPARISON:  None Available. FINDINGS: The bowel gas pattern is normal. There is no evidence of free air. Large volume stool throughout the colon and rectum. No radio-opaque calculi or other significant radiographic abnormality is seen. IMPRESSION: Large volume stool throughout the colon and rectum. Electronically Signed   By: Davina Poke D.O.   On: 08/10/2022 18:50    Procedures Procedures (including critical care time)  Medications Ordered in UC Medications - No data to display  Initial Impression / Assessment and Plan / UC Course  I have reviewed the triage vital signs and the nursing notes.  Pertinent labs & imaging results that were available during my care of the patient were reviewed by me and considered in my medical decision making (see chart for details).   Patient is a nontoxic-appearing 22 year old male here for evaluation of abdominal pain, bloating, and diarrhea x 5 days.  He states that he has not had any fever and has not noticed any blood in his stool.  He has not had any nausea or vomiting.  He does eat a lot of processed foods and snack foods.  When asking about his last normal bowel movement he was not able to give a clear answer but he does mention that if he had to have a bowel movement that he would have to sit for a while which makes me think he may be dealing with some constipation.  On exam patient's cardiopulmonary exam is benign and his abdomen is soft, flat, and nontender.  Also no distention noted.  I am going to obtain a two-view of the abdomen to look for any potential air-fluid levels or constipation which could be  contributing to the patient's symptoms.  Radiology interpretation of two-view abdomen states that the bowel  gas pattern is normal with no evidence of free air.  There is a large volume of stool throughout the colon and rectum.  I will discharge the patient home with a diagnosis of constipation.   Final Clinical Impressions(s) / UC Diagnoses   Final diagnoses:  Constipation, unspecified constipation type     Discharge Instructions      Your abdominal x-ray today revealed the presence of a large volume of stool within your colon and rectum.  I believe this is what is causing your abdominal pain as well as your diarrhea.  Purchase over-the-counter MiraLAX from the store and take 2 capfuls in 16 ounces of a beverage of your choice this evening.  Take 1 capful each morning going forward until you achieve regular bowel movements.  You can also use 2 over-the-counter Dulcolax tablets tonight to help eliminate your constipation.  You need to increase your dietary intake of fresh fruits and vegetables and drastically cut back your processed and snack food consumption.  We also need to increase your oral water consumption to at least a gallon a day.  If you develop any fever, increased abdominal pain, nausea and vomiting and are unable to keep down fluids, or increased belly distention you need to go to the ER for evaluation.     ED Prescriptions   None    PDMP not reviewed this encounter.   Margarette Canada, NP 08/10/22 1904

## 2022-08-10 NOTE — ED Triage Notes (Signed)
Patient c/o abdominal pain and diarrhea for the past 5 days ago.  Patient has tried Tums and Pepto Bismoyl.  Patient denies N/V.  Patient denies fevers.

## 2022-08-10 NOTE — Discharge Instructions (Addendum)
Your abdominal x-ray today revealed the presence of a large volume of stool within your colon and rectum.  I believe this is what is causing your abdominal pain as well as your diarrhea.  Purchase over-the-counter MiraLAX from the store and take 2 capfuls in 16 ounces of a beverage of your choice this evening.  Take 1 capful each morning going forward until you achieve regular bowel movements.  You can also use 2 over-the-counter Dulcolax tablets tonight to help eliminate your constipation.  You need to increase your dietary intake of fresh fruits and vegetables and drastically cut back your processed and snack food consumption.  We also need to increase your oral water consumption to at least a gallon a day.  If you develop any fever, increased abdominal pain, nausea and vomiting and are unable to keep down fluids, or increased belly distention you need to go to the ER for evaluation.

## 2022-08-22 ENCOUNTER — Other Ambulatory Visit: Payer: Self-pay | Admitting: Nurse Practitioner

## 2022-08-22 NOTE — Telephone Encounter (Signed)
Requested medication (s) are due for refill today: yes  Requested medication (s) are on the active medication list: yes    Last refill: Vyvanse 10 mg  05/04/22  #30  0 refills     Vyvanse 30mg  07/24/22  #15  0 refills  Future visit scheduled no  Notes to clinic:Not delegated, please review.  Requested Prescriptions  Pending Prescriptions Disp Refills   lisdexamfetamine (VYVANSE) 10 MG capsule 30 capsule 0    Sig: Take 1 capsule (10 mg total) by mouth daily.     Not Delegated - Psychiatry:  Stimulants/ADHD Failed - 08/22/2022 11:45 AM      Failed - This refill cannot be delegated      Passed - Urine Drug Screen completed in last 360 days      Passed - Last BP in normal range    BP Readings from Last 1 Encounters:  08/10/22 119/78         Passed - Last Heart Rate in normal range    Pulse Readings from Last 1 Encounters:  08/10/22 60         Passed - Valid encounter within last 6 months    Recent Outpatient Visits           3 months ago Attention deficit hyperactivity disorder (ADHD), unspecified ADHD type   [place], Karen, NP   6 months ago Attention deficit hyperactivity disorder (ADHD), unspecified ADHD type   [place], Karen, NP   10 months ago Attention deficit hyperactivity disorder (ADHD), unspecified ADHD type   [place] Jon Billings, NP   1 year ago Attention deficit hyperactivity disorder (ADHD), unspecified ADHD type   [place] Jon Billings, NP   1 year ago Attention deficit hyperactivity disorder (ADHD), unspecified ADHD type   [place] Jon Billings, NP               lisdexamfetamine (VYVANSE) 30 MG capsule 15 capsule 0    Sig: Take 1 capsule (30 mg total) by mouth daily.     Not Delegated - Psychiatry:  Stimulants/ADHD Failed - 08/22/2022 11:45 AM      Failed - This refill  cannot be delegated      Passed - Urine Drug Screen completed in last 360 days      Passed - Last BP in normal range    BP Readings from Last 1 Encounters:  08/10/22 119/78         Passed - Last Heart Rate in normal range    Pulse Readings from Last 1 Encounters:  08/10/22 60         Passed - Valid encounter within last 6 months    Recent Outpatient Visits           3 months ago Attention deficit hyperactivity disorder (ADHD), unspecified ADHD type   [place], Karen, NP   6 months ago Attention deficit hyperactivity disorder (ADHD), unspecified ADHD type   [place], NP   10 months ago Attention deficit hyperactivity disorder (ADHD), unspecified ADHD type   [place] Jon Billings, NP   1 year ago Attention deficit hyperactivity disorder (ADHD), unspecified ADHD type   [place], NP   1 year ago Attention deficit hyperactivity disorder (ADHD), unspecified ADHD type   [facility] Jon Billings, NP

## 2022-08-22 NOTE — Telephone Encounter (Signed)
Medication Refill - Medication:   lisdexamfetamine (VYVANSE) 10 MG capsule  lisdexamfetamine (VYVANSE) 30 MG capsule   Has the patient contacted their pharmacy? Yes.   (Agent: If no, request that the patient contact the pharmacy for the refill. If patient does not wish to contact the pharmacy document the reason why and proceed with request.) (Agent: If yes, when and what did the pharmacy advise?)  Preferred Pharmacy (with phone number or street name):  [facility] [place], [address]  [place] [address]  Phone: [phone] Fax: [phone]   Has the patient been seen for an appointment in the last year OR does the patient have an upcoming appointment? Yes.    Agent: Please be advised that RX refills may take up to 3 business days. We ask that you follow-up with your pharmacy.

## 2022-08-29 NOTE — Progress Notes (Unsigned)
There were no vitals taken for this visit.   Subjective:    Patient ID: Robert Lane, male    DOB: [birth year], 22 y.o.   MRN: [id number]  HPI: Robert Lane is a 22 y.o. male  No chief complaint on file.  ADHD FOLLOW UP Patient would like to change his dose to 10mg  everyday during the week when he isn't working.  Then he takes Vyvanses 30mg  on the weekends when he is at work.  Feels like these doses work well for him.  ADHD status: uncontrolled Satisfied with current therapy: yes Medication compliance:  excellent compliance Controlled substance contract: no Previous psychiatry evaluation:  unsure Previous medications: yes adderall   Taking meds on weekends/vacations: yes Work/school performance:  excellent Difficulty sustaining attention/completing tasks: no Distracted by extraneous stimuli:  sometimes Does not listen when spoken to: no  Fidgets with hands or feet: yes Unable to stay in seat: no Blurts out/interrupts others: no ADHD Medication Side Effects: no    Decreased appetite: no    Headache: no    Sleeping disturbance pattern: yes    Irritability: no    Rebound effects (worse than baseline) off medication: no    Anxiousness: no    Dizziness: no    Tics: no    Relevant past medical, surgical, family and social history reviewed and updated as indicated. Interim medical history since our last visit reviewed. Allergies and medications reviewed and updated.  Review of Systems  Neurological:  Negative for dizziness and headaches.  Psychiatric/Behavioral:  Positive for decreased concentration and sleep disturbance. Negative for agitation, behavioral problems and dysphoric mood. The patient is not nervous/anxious and is not hyperactive.     Per HPI unless specifically indicated above     Objective:    There were no vitals taken for this visit.  Wt Readings from Last 3 Encounters:  08/10/22 160 lb (72.6 kg)  02/01/22 171 lb 12.8 oz (77.9 kg)  02/22/21 160 lb 2  oz (72.6 kg)    Physical Exam Vitals and nursing note reviewed.  Constitutional:      General: He is not in acute distress.    Appearance: He is not ill-appearing.  HENT:     Head: Normocephalic.     Right Ear: Hearing normal.     Left Ear: Hearing normal.     Nose: Nose normal.  Pulmonary:     Effort: Pulmonary effort is normal. No respiratory distress.  Neurological:     Mental Status: He is alert.  Psychiatric:        Mood and Affect: Mood normal.        Behavior: Behavior normal.        Thought Content: Thought content normal.        Judgment: Judgment normal.    Results for orders placed or performed in visit on 02/01/22  [id number] 11+Oxyco+Alc+Crt-Bund  Result Value Ref Range   Ethanol Negative Cutoff=0.020 %   Amphetamines, Urine Negative Cutoff=1000 ng/mL   Barbiturate Negative Cutoff=200 ng/mL   BENZODIAZ UR QL Negative Cutoff=200 ng/mL   Cannabinoid Quant, Ur Negative Cutoff=50 ng/mL   Cocaine (Metabolite) Negative Cutoff=300 ng/mL   OPIATE SCREEN URINE Negative Cutoff=300 ng/mL   Oxycodone/Oxymorphone, Urine Negative Cutoff=300 ng/mL   Phencyclidine Negative Cutoff=25 ng/mL   Methadone Screen, Urine Negative Cutoff=300 ng/mL   Propoxyphene Negative Cutoff=300 ng/mL   Meperidine Negative Cutoff=200 ng/mL   Tramadol Negative Cutoff=200 ng/mL   Creatinine 123.9 20.0 - 300.0 mg/dL   pH, Urine 6.3 4.5 -  8.9      Assessment & Plan:   Problem List Items Addressed This Visit      Other   ADHD - Primary     Follow up plan: No follow-ups on file.   This visit was completed via MyChart due to the restrictions of the COVID-19 pandemic. All issues as above were discussed and addressed. Physical exam was done as above through visual confirmation on MyChart. If it was felt that the patient should be evaluated in the office, they were directed there. The patient verbally consented to this visit. Location of the patient: Home Location of the provider: Office Those  involved with this call:  Provider: Jon Billings, NP CMA: Valinda Hoar, [place] Desk/Registration: Lynnell Catalan This encounter was conducted via video.  I spent 20 dedicated to the care of this patient on the date of this encounter to include previsit review of medications, refills, follow up, face to face time with the patient, and post visit ordering of testing.

## 2022-08-30 ENCOUNTER — Ambulatory Visit (INDEPENDENT_AMBULATORY_CARE_PROVIDER_SITE_OTHER): Payer: Medicaid Other | Admitting: Nurse Practitioner

## 2022-08-30 ENCOUNTER — Encounter: Payer: Self-pay | Admitting: Nurse Practitioner

## 2022-08-30 VITALS — BP 95/58 | HR 60 | Temp 98.2°F | Wt 169.3 lb

## 2022-08-30 DIAGNOSIS — F909 Attention-deficit hyperactivity disorder, unspecified type: Secondary | ICD-10-CM | POA: Diagnosis not present

## 2022-08-30 MED ORDER — LISDEXAMFETAMINE DIMESYLATE 10 MG PO CAPS: 10.0000 mg | ORAL_CAPSULE | Freq: Every day | ORAL | 0 refills | Status: DC

## 2022-08-30 MED ORDER — LISDEXAMFETAMINE DIMESYLATE 30 MG PO CAPS: 30.0000 mg | ORAL_CAPSULE | Freq: Every day | ORAL | 0 refills | Status: DC

## 2022-08-30 NOTE — Assessment & Plan Note (Signed)
Chronic. Doing well with current doses of medications.  Will continue with Vyvanse to 10mg  daily.  Except on the weekends he takes Vyvanse 30mg  when he is working.  PDMP Checked.  Denies other concerns at visit today.  Refills sent.  UDS and controlled substance agreement up to date.  Follow up in 3 months.  Call sooner if concerns arise.

## 2022-10-12 ENCOUNTER — Other Ambulatory Visit: Payer: Self-pay | Admitting: Nurse Practitioner

## 2022-10-12 NOTE — Telephone Encounter (Signed)
Requested medication (s) are due for refill today - yes  Requested medication (s) are on the active medication list -yes  Future visit scheduled -yes  Last refill: 08/30/22 #30  Notes to clinic: non delegated Rx  Requested Prescriptions  Pending Prescriptions Disp Refills   lisdexamfetamine (VYVANSE) 10 MG capsule 30 capsule 0    Sig: Take 1 capsule (10 mg total) by mouth daily.     Not Delegated - Psychiatry:  Stimulants/ADHD Failed - 10/12/2022  1:44 PM      Failed - This refill cannot be delegated      Passed - Urine Drug Screen completed in last 360 days      Passed - Last BP in normal range    BP Readings from Last 1 Encounters:  08/30/22 (!) 95/58         Passed - Last Heart Rate in normal range    Pulse Readings from Last 1 Encounters:  08/30/22 60         Passed - Valid encounter within last 6 months    Recent Outpatient Visits           1 month ago Attention deficit hyperactivity disorder (ADHD), unspecified ADHD type   [place] Robert Billings, NP   5 months ago Attention deficit hyperactivity disorder (ADHD), unspecified ADHD type   [place] Robert Billings, NP   8 months ago Attention deficit hyperactivity disorder (ADHD), unspecified ADHD type   [place], Karen, NP   12 months ago Attention deficit hyperactivity disorder (ADHD), unspecified ADHD type   [place] Robert Billings, NP   1 year ago Attention deficit hyperactivity disorder (ADHD), unspecified ADHD type   [place] Robert Billings, NP       Future Appointments             In 1 month Robert Billings, NP [place], [facility]               Requested Prescriptions  Pending Prescriptions Disp Refills   lisdexamfetamine (VYVANSE) 10 MG capsule 30 capsule 0    Sig: Take 1 capsule (10 mg total) by mouth daily.      Not Delegated - Psychiatry:  Stimulants/ADHD Failed - 10/12/2022  1:44 PM      Failed - This refill cannot be delegated      Passed - Urine Drug Screen completed in last 360 days      Passed - Last BP in normal range    BP Readings from Last 1 Encounters:  08/30/22 (!) 95/58         Passed - Last Heart Rate in normal range    Pulse Readings from Last 1 Encounters:  08/30/22 60         Passed - Valid encounter within last 6 months    Recent Outpatient Visits           1 month ago Attention deficit hyperactivity disorder (ADHD), unspecified ADHD type   [place], NP   5 months ago Attention deficit hyperactivity disorder (ADHD), unspecified ADHD type   [place], NP   8 months ago Attention deficit hyperactivity disorder (ADHD), unspecified ADHD type   [place], NP   12 months ago Attention deficit hyperactivity disorder (ADHD), unspecified ADHD type   [facility] Robert Billings, NP   1 year ago Attention deficit hyperactivity disorder (ADHD), unspecified ADHD type   [place] Robert Billings, NP       Future Appointments             In 1 month Robert Billings, NP [place], [facility]              c

## 2022-10-12 NOTE — Telephone Encounter (Signed)
Medication Refill - Medication: lisdexamfetamine (VYVANSE) 10 MG capsule [id number]   Has the patient contacted their pharmacy? Yes.   (Agent: If no, request that the patient contact the pharmacy for the refill. If patient does not wish to contact the pharmacy document the reason why and proceed with request.) (Agent: If yes, when and what did the pharmacy advise?)  Preferred Pharmacy (with phone number or street name): [facility] [place], [place] - [address]  [place], [place] [address]   Has the patient been seen for an appointment in the last year OR does the patient have an upcoming appointment? Yes.    Agent: Please be advised that RX refills may take up to 3 business days. We ask that you follow-up with your pharmacy.

## 2022-10-15 MED ORDER — LISDEXAMFETAMINE DIMESYLATE 10 MG PO CAPS: 10.0000 mg | ORAL_CAPSULE | Freq: Every day | ORAL | 0 refills | Status: DC

## 2022-10-18 ENCOUNTER — Other Ambulatory Visit: Payer: Self-pay | Admitting: Nurse Practitioner

## 2022-10-18 NOTE — Telephone Encounter (Signed)
Pts mother is calling to report that she has not heard an update on the patient's Vyvanse. Reports that the patient is needing the medication for work. Please advise.  CB- [id number]

## 2022-10-18 NOTE — Telephone Encounter (Signed)
Requested medications are due for refill today.  no  Requested medications are on the active medications list.  yes  Last refill. 10/15/2022 #30 0 rf  Future visit scheduled.   yes  Notes to clinic.  Refill not delegated.    Requested Prescriptions  Pending Prescriptions Disp Refills   lisdexamfetamine (VYVANSE) 30 MG capsule 15 capsule 0    Sig: Take 1 capsule (30 mg total) by mouth daily.     Not Delegated - Psychiatry:  Stimulants/ADHD Failed - 10/18/2022  5:31 PM      Failed - This refill cannot be delegated      Passed - Urine Drug Screen completed in last 360 days      Passed - Last BP in normal range    BP Readings from Last 1 Encounters:  08/30/22 (!) 95/58         Passed - Last Heart Rate in normal range    Pulse Readings from Last 1 Encounters:  08/30/22 60         Passed - Valid encounter within last 6 months    Recent Outpatient Visits           1 month ago Attention deficit hyperactivity disorder (ADHD), unspecified ADHD type   [place], NP   5 months ago Attention deficit hyperactivity disorder (ADHD), unspecified ADHD type   [place], NP   8 months ago Attention deficit hyperactivity disorder (ADHD), unspecified ADHD type   [place] Jon Billings, NP   1 year ago Attention deficit hyperactivity disorder (ADHD), unspecified ADHD type   [place] Jon Billings, NP   1 year ago Attention deficit hyperactivity disorder (ADHD), unspecified ADHD type   [place] Jon Billings, NP       Future Appointments             In 1 month Jon Billings, NP [place], [facility]

## 2022-10-18 NOTE — Telephone Encounter (Signed)
Medication Refill - Medication: lisdexamfetamine (VYVANSE) 30 MG capsule   Has the patient contacted their pharmacy? Yes.   No, more refills.  (Agent: If no, request that the patient contact the pharmacy for the refill. If patient does not wish to contact the pharmacy document the reason why and proceed with request.)   Preferred Pharmacy (with phone number or street name):  [facility] [place] [address]  [place] [address]  Phone: [phone] Fax: [phone]  Hours: Not open 24 hours   Has the patient been seen for an appointment in the last year OR does the patient have an upcoming appointment? Yes.    Agent: Please be advised that RX refills may take up to 3 business days. We ask that you follow-up with your pharmacy.

## 2022-10-18 NOTE — Telephone Encounter (Signed)
Requested medication (s) are due for refill today: yes  Requested medication (s) are on the active medication list: yes  Last refill:  08/30/22 #15 0 refills  Future visit scheduled: yes 1 month  Notes to clinic:  not delegated per protocol. Do you want to refill Rx?     Requested Prescriptions  Pending Prescriptions Disp Refills   lisdexamfetamine (VYVANSE) 30 MG capsule 15 capsule 0    Sig: Take 1 capsule (30 mg total) by mouth daily.     Not Delegated - Psychiatry:  Stimulants/ADHD Failed - 10/18/2022  2:51 PM      Failed - This refill cannot be delegated      Passed - Urine Drug Screen completed in last 360 days      Passed - Last BP in normal range    BP Readings from Last 1 Encounters:  08/30/22 (!) 95/58         Passed - Last Heart Rate in normal range    Pulse Readings from Last 1 Encounters:  08/30/22 60         Passed - Valid encounter within last 6 months    Recent Outpatient Visits           1 month ago Attention deficit hyperactivity disorder (ADHD), unspecified ADHD type   [place], NP   5 months ago Attention deficit hyperactivity disorder (ADHD), unspecified ADHD type   [place], NP   8 months ago Attention deficit hyperactivity disorder (ADHD), unspecified ADHD type   [place] Jon Billings, NP   1 year ago Attention deficit hyperactivity disorder (ADHD), unspecified ADHD type   [place] Jon Billings, NP   1 year ago Attention deficit hyperactivity disorder (ADHD), unspecified ADHD type   [place] Jon Billings, NP       Future Appointments             In 1 month Jon Billings, NP [place], [facility]

## 2022-10-19 ENCOUNTER — Telehealth: Payer: Self-pay | Admitting: Nurse Practitioner

## 2022-10-19 MED ORDER — LISDEXAMFETAMINE DIMESYLATE 10 MG PO CAPS: 10.0000 mg | ORAL_CAPSULE | Freq: Every day | ORAL | 0 refills | Status: DC

## 2022-10-19 NOTE — Telephone Encounter (Signed)
Pt mother Rip Harbour is very upset that the Rx refill has not been approved. Pt mother requests call back asap at [phone]

## 2022-10-19 NOTE — Telephone Encounter (Signed)
error 

## 2022-10-19 NOTE — Addendum Note (Signed)
Addended by: Jon Billings on: 10/19/2022 12:02 PM   Modules accepted: Orders

## 2022-10-19 NOTE — Telephone Encounter (Signed)
Vyvanse 30mg  is already at the pharmacy.  Sent 10mg  dose for patient.

## 2022-10-22 MED ORDER — LISDEXAMFETAMINE DIMESYLATE 30 MG PO CAPS: 30.0000 mg | ORAL_CAPSULE | Freq: Every day | ORAL | 0 refills | Status: DC

## 2022-10-22 NOTE — Telephone Encounter (Signed)
Patients mom Robert Lane called stated her son needed Vyvanse 30mg  and also the 10mg . Mom said her son has not had his medication since 03/22 and really needs it. Pharmacy is saying they do not have a script for either medication dosage. Mom is very upset, please contact her to let her know when script will be sent.

## 2022-11-21 ENCOUNTER — Other Ambulatory Visit: Payer: Self-pay | Admitting: Nurse Practitioner

## 2022-11-21 NOTE — Telephone Encounter (Signed)
Medication Refill - Medication: lisdexamfetamine (VYVANSE) 10 MG capsule  Has the patient contacted their pharmacy? No.  Preferred Pharmacy (with phone number or street name):  [id number] - [place], [place] - [address] AT [facility] [address] [facility] Phone: [phone]  Fax: [phone]     Has the patient been seen for an appointment in the last year OR does the patient have an upcoming appointment? Yes.    Agent: Please be advised that RX refills may take up to 3 business days. We ask that you follow-up with your pharmacy.

## 2022-11-22 MED ORDER — LISDEXAMFETAMINE DIMESYLATE 10 MG PO CAPS: 10.0000 mg | ORAL_CAPSULE | Freq: Every day | ORAL | 0 refills | Status: DC

## 2022-11-22 NOTE — Telephone Encounter (Signed)
Requested medication (s) are due for refill today:   Provider to review  Requested medication (s) are on the active medication list:   Yes  Future visit scheduled:   Yes in 6 days with Clydie Braun   Last ordered: 10/19/2022 #30, 0 refills for 10 mg dose  Non delegated refill    Requested Prescriptions  Pending Prescriptions Disp Refills   lisdexamfetamine (VYVANSE) 10 MG capsule 30 capsule 0    Sig: Take 1 capsule (10 mg total) by mouth daily.     Not Delegated - Psychiatry:  Stimulants/ADHD Failed - 11/21/2022  4:59 PM      Failed - This refill cannot be delegated      Passed - Urine Drug Screen completed in last 360 days      Passed - Last BP in normal range    BP Readings from Last 1 Encounters:  08/30/22 (!) 95/58         Passed - Last Heart Rate in normal range    Pulse Readings from Last 1 Encounters:  08/30/22 60         Passed - Valid encounter within last 6 months    Recent Outpatient Visits           2 months ago Attention deficit hyperactivity disorder (ADHD), unspecified ADHD type   [place] [facility] Larae Grooms, NP   6 months ago Attention deficit hyperactivity disorder (ADHD), unspecified ADHD type   [place] [facility] Larae Grooms, NP   9 months ago Attention deficit hyperactivity disorder (ADHD), unspecified ADHD type   [place] [facility] Larae Grooms, NP   1 year ago Attention deficit hyperactivity disorder (ADHD), unspecified ADHD type   [place] [facility] Larae Grooms, NP   1 year ago Attention deficit hyperactivity disorder (ADHD), unspecified ADHD type   [place] [facility] Larae Grooms, NP       Future Appointments             In 6 days Larae Grooms, NP  [facility]

## 2022-11-28 ENCOUNTER — Telehealth (INDEPENDENT_AMBULATORY_CARE_PROVIDER_SITE_OTHER): Payer: Medicaid Other | Admitting: Nurse Practitioner

## 2022-11-28 ENCOUNTER — Encounter: Payer: Self-pay | Admitting: Nurse Practitioner

## 2022-11-28 DIAGNOSIS — F909 Attention-deficit hyperactivity disorder, unspecified type: Secondary | ICD-10-CM

## 2022-11-28 NOTE — Progress Notes (Signed)
There were no vitals taken for this visit.   Subjective:    Patient ID: Robert Lane, male    DOB: 27-Nov-2000, 22 y.o.   MRN: [id number]  HPI: Robert Lane is a 22 y.o. male  Chief Complaint  Patient presents with   ADHD   ADHD FOLLOW UP Patient states 2-3 weeks ago he stopped taking all of his medications.  He would like to take a break from the medication.  He may want to restart in the future if symptoms are not well controlled.  He was previously taking 10mg  everyday during the week when he isn't working.  Then he takes Vyvanses 30mg  on the weekends when he is at work.  Denies concerns regarding his medications at visit today.   ADHD status: uncontrolled Satisfied with current therapy: yes Medication compliance:  excellent compliance Controlled substance contract: no Previous psychiatry evaluation:  unsure Previous medications: yes adderall   Taking meds on weekends/vacations: yes Work/school performance:  excellent Difficulty sustaining attention/completing tasks: no Distracted by extraneous stimuli:  sometimes Does not listen when spoken to: no  Fidgets with hands or feet: yes Unable to stay in seat: no Blurts out/interrupts others: no ADHD Medication Side Effects: no    Decreased appetite: no    Headache: no    Sleeping disturbance pattern: yes    Irritability: no    Rebound effects (worse than baseline) off medication: no    Anxiousness: no    Dizziness: no    Tics: no    Relevant past medical, surgical, family and social history reviewed and updated as indicated. Interim medical history since our last visit reviewed. Allergies and medications reviewed and updated.  Review of Systems  Neurological:  Negative for dizziness and headaches.  Psychiatric/Behavioral:  Negative for agitation, behavioral problems, decreased concentration, dysphoric mood and sleep disturbance. The patient is not nervous/anxious and is not hyperactive.     Per HPI unless  specifically indicated above     Objective:    There were no vitals taken for this visit.  Wt Readings from Last 3 Encounters:  08/30/22 169 lb 4.8 oz (76.8 kg)  08/10/22 160 lb (72.6 kg)  02/01/22 171 lb 12.8 oz (77.9 kg)    Physical Exam Vitals and nursing note reviewed.  Constitutional:      General: He is not in acute distress.    Appearance: He is not ill-appearing.  HENT:     Head: Normocephalic.     Right Ear: Hearing normal.     Left Ear: Hearing normal.     Nose: Nose normal.  Pulmonary:     Effort: Pulmonary effort is normal. No respiratory distress.  Neurological:     Mental Status: He is alert.  Psychiatric:        Mood and Affect: Mood normal.        Behavior: Behavior normal.        Thought Content: Thought content normal.        Judgment: Judgment normal.     Results for orders placed or performed in visit on 02/01/22  [id number] 11+Oxyco+Alc+Crt-Bund  Result Value Ref Range   Ethanol Negative Cutoff=0.020 %   Amphetamines, Urine Negative Cutoff=1000 ng/mL   Barbiturate Negative Cutoff=200 ng/mL   BENZODIAZ UR QL Negative Cutoff=200 ng/mL   Cannabinoid Quant, Ur Negative Cutoff=50 ng/mL   Cocaine (Metabolite) Negative Cutoff=300 ng/mL   OPIATE SCREEN URINE Negative Cutoff=300 ng/mL   Oxycodone/Oxymorphone, Urine Negative Cutoff=300 ng/mL   Phencyclidine Negative Cutoff=25 ng/mL  Methadone Screen, Urine Negative Cutoff=300 ng/mL   Propoxyphene Negative Cutoff=300 ng/mL   Meperidine Negative Cutoff=200 ng/mL   Tramadol Negative Cutoff=200 ng/mL   Creatinine 123.9 20.0 - 300.0 mg/dL   pH, Urine 6.3 4.5 - 8.9      Assessment & Plan:   Problem List Items Addressed This Visit       Other   ADHD - Primary    Chronic.  Controlled without medication at this time.  He would like to continue without medication.  However, he may want to restart in the future if symptoms are not well controlled.  Follow up if he would like to restart medication.           Follow up plan: Return if symptoms worsen or fail to improve.   This visit was completed via MyChart due to the restrictions of the COVID-19 pandemic. All issues as above were discussed and addressed. Physical exam was done as above through visual confirmation on MyChart. If it was felt that the patient should be evaluated in the office, they were directed there. The patient verbally consented to this visit. Location of the patient: Home Location of the provider: Office Those involved with this call:  Provider: Larae Grooms, NP CMA: Wilhemena Durie, CMA Front Desk/Registration: Servando Snare This encounter was conducted via video.  I spent 20 dedicated to the care of this patient on the date of this encounter to include previsit review of symptoms, plan of care and follow up, face to face time with the patient, and post visit ordering of testing.

## 2022-11-28 NOTE — Assessment & Plan Note (Signed)
Chronic.  Controlled without medication at this time.  He would like to continue without medication.  However, he may want to restart in the future if symptoms are not well controlled.  Follow up if he would like to restart medication.

## 2022-12-19 ENCOUNTER — Telehealth: Payer: Self-pay | Admitting: Nurse Practitioner

## 2022-12-19 NOTE — Telephone Encounter (Signed)
Medication Refill - Medication: Adderall 10 and Adderall 30   Pt will be out Friday.   Has the patient contacted their pharmacy? Yes.   (Agent: If no, request that the patient contact the pharmacy for the refill. If patient does not wish to contact the pharmacy document the reason why and proceed with request.) (Agent: If yes, when and what did the pharmacy advise?)  Preferred Pharmacy (with phone number or street name):  [id number] [place], [place] - [address] AT [facility] OF [address] & [facility]  [address] [place] [address]  Phone: [phone] Fax: [phone]   Has the patient been seen for an appointment in the last year OR does the patient have an upcoming appointment? Yes.    Agent: Please be advised that RX refills may take up to 3 business days. We ask that you follow-up with your pharmacy.

## 2022-12-20 NOTE — Telephone Encounter (Signed)
Called and LVM asking for patient to please return my call to discuss.   OK for PEC to speak to patient and advise him of Karen's message regarding the medication. Please schedule the patient an appointment if he would like a prescription for the medication.

## 2022-12-20 NOTE — Telephone Encounter (Signed)
Patient stated at last visit that he was no longer taking the medication.  He did not want to continue with it.  He was told that if he wanted to restart the medication he would need another visit.

## 2023-01-01 ENCOUNTER — Telehealth: Payer: Self-pay | Admitting: Nurse Practitioner

## 2023-01-01 NOTE — Telephone Encounter (Signed)
Pt's mother is calling in because she says pt says he needs a new prescription sent to [facility] in [place] for lisdexamfetamine [facility]). Pt's mother says she has been waiting since March for this. Please advise.

## 2023-01-02 NOTE — Telephone Encounter (Signed)
Left message on machine that he will need to come in the office for an office visit in oreder to start medication for him to call the office to schedule.

## 2023-01-02 NOTE — Telephone Encounter (Signed)
Per last office visit note, patient was not taking medication and did not wish to restart at this time. Per Colin Broach note, patient was to have a follow up visit if medication would like to be restarted. Please call and schedule an appointment for the patient to come in and discuss with Clydie Braun.

## 2023-02-17 DIAGNOSIS — Z79899 Other long term (current) drug therapy: Secondary | ICD-10-CM | POA: Insufficient documentation

## 2023-02-17 NOTE — Patient Instructions (Signed)
Blue Lizard for sensitive skin sunscreen and Eucerin has a sensitive skin sun screen  Attention Deficit Hyperactivity Disorder, Adult Attention deficit hyperactivity disorder (ADHD) is a mental health disorder that starts during childhood. For many people with ADHD, the disorder continues into the adult years. Treatment can help you manage your symptoms. There are three main types of ADHD: Inattentive. With this type, adults have difficulty paying attention. This may affect cognitive abilities. Hyperactive-impulsive. With this type, adults have a lot of energy and have difficulty controlling their behavior. Combination type. Some people may have symptoms of both types. What are the causes? The exact cause of ADHD is not known. Most experts believe a person's genes and environment possibly contribute to ADHD. What increases the risk? The following factors may make you more likely to develop this condition: Having a first-degree relative such as a parent, brother, or sister, with the condition. Being born before 37 weeks of pregnancy (prematurely) or at a low birth weight. Being born to a mother who smoked tobacco or drank alcohol during pregnancy. Having experienced a brain injury. Being exposed to lead or other toxins in the womb or early in life. What are the signs or symptoms? Symptoms of this condition depend on the type of ADHD. Symptoms of the inattentive type include: Difficulty paying attention or following instructions. Often making simple mistakes. Being disorganized. Avoiding tasks that require time and attention. Losing and forgetting things. Symptoms of the hyperactive-impulsive type include: Restlessness. Talking out of turn, interrupting others, or talking too much. Difficulty with: Sitting still. Feeling motivated. Relaxing. Waiting in line or waiting for a turn. People with the combination type have symptoms of both of the other types. In adults, this condition may  lead to certain problems, such as: Keeping jobs. Performing tasks at work. Having stable relationships. Being on time or keeping to a schedule. How is this diagnosed? This condition is diagnosed based on your current symptoms and your history of symptoms. The diagnosis can be made by a health care provider such as a primary care provider or a mental health care specialist. Your health care provider may use a symptom checklist or a behavior rating scale to evaluate your symptoms. Your health care provider may also want to talk with people who have observed your behaviors throughout your life. How is this treated? This condition can be treated with medicines and behavior therapy. Medicines may be the best option to reduce impulsive behaviors and improve attention. Your health care provider may recommend: Stimulant medicines. These are the most common medicines used for adult ADHD. They affect certain chemicals in the brain (neurotransmitters) and improve your ability to control your symptoms. A non-stimulant medicine. These medicines can also improve focus, attention, and impulsive behavior. It may take weeks to months to see the effects of this medicine. Counseling and behavioral management are also important for treating ADHD. Counseling is often used along with medicine. Your health care provider may suggest: Cognitive behavioral therapy (CBT). This type of therapy teaches you to replace negative thoughts and actions with positive thoughts and actions. When used as part of ADHD treatment, this therapy may also include: Coping strategies for organization, time management, impulse control, and stress reduction. Mindfulness and meditation training. Behavioral management. You may work with a coach who is specially trained to help people with ADHD manage and organize activities and function more effectively. Follow these instructions at home: Medicines  Take over-the-counter and prescription  medicines only as told by your health care provider.   Talk with your health care provider about the possible side effects of your medicines and how to manage them. Alcohol use Do not drink alcohol if: Your health care provider tells you not to drink. You are pregnant, may be pregnant, or are planning to become pregnant. If you drink alcohol: Limit how much you use to: 0-1 drink a day for women. 0-2 drinks a day for men. Know how much alcohol is in your drink. In the [place] one drink equals one 12 oz bottle of beer (355 mL), one 5 oz glass of wine (148 mL), or one 1 oz glass of hard liquor (44 mL). Lifestyle  Do not use illegal drugs. Get enough sleep. Eat a healthy diet. Exercise regularly. Exercise can help to reduce stress and anxiety. General instructions Learn as much as you can about adult ADHD, and work closely with your health care providers to find the treatments that work best for you. Follow the same schedule each day. Use reminder devices like notes, calendars, and phone apps to stay on time and organized. Keep all follow-up visits. Your health care provider will need to monitor your condition and adjust your treatment over time. Where to find more information A health care provider may be able to recommend resources that are available online or over the phone. You could start with: Attention Deficit Disorder Association (ADDA): add.org National Institute of Mental Health (NIMH): nimh.nih.gov Contact a health care provider if: Your symptoms continue to cause problems. You have side effects from your medicine, such as: Repeated muscle twitches, coughing, or speech outbursts. Sleep problems. Loss of appetite. Dizziness. Unusually fast heartbeat. Stomach pains. Headaches. You are struggling with anxiety, depression, or substance abuse. Get help right away if: You have a severe reaction to a medicine. This symptom may be an emergency. Get help right away. Call 911. Do  not wait to see if the symptom will go away. Do not drive yourself to the hospital. Take one of these steps if you feel like you may hurt yourself or others, or have thoughts about taking your own life: Go to your nearest emergency room. Call 911. Call the National Suicide Prevention Lifeline at 1-800-273-8255 or 988. This is open 24 hours a day Text the Crisis Text Line at 741741. Summary ADHD is a mental health disorder that starts during childhood and often continues into your adult years. The exact cause of ADHD is not known. Most experts believe genetics and environmental factors contribute to ADHD. There is no cure for ADHD, but treatment with medicine, cognitive behavioral therapy, or behavioral management can help you manage your condition. This information is not intended to replace advice given to you by your health care provider. Make sure you discuss any questions you have with your health care provider. Document Revised: 11/03/2021 Document Reviewed: 11/03/2021 Elsevier Patient Education  2024 Elsevier Inc.  

## 2023-02-19 ENCOUNTER — Telehealth: Payer: Medicaid Other | Admitting: Nurse Practitioner

## 2023-02-19 ENCOUNTER — Encounter: Payer: Self-pay | Admitting: Nurse Practitioner

## 2023-02-19 DIAGNOSIS — Z79899 Other long term (current) drug therapy: Secondary | ICD-10-CM

## 2023-02-19 DIAGNOSIS — F902 Attention-deficit hyperactivity disorder, combined type: Secondary | ICD-10-CM | POA: Diagnosis not present

## 2023-02-19 NOTE — Progress Notes (Signed)
There were no vitals taken for this visit.   Subjective:    Patient ID: Robert Lane, male    DOB: 2001-01-29, 22 y.o.   MRN: [id number]  HPI: Robert Lane is a 22 y.o. male  Chief Complaint  Patient presents with   Medication Problem    Patient is not taking any medications.   This visit was completed via video visit through MyChart due to the restrictions of the COVID-19 pandemic. All issues as above were discussed and addressed. Physical exam was done as above through visual confirmation on video through MyChart. If it was felt that the patient should be evaluated in the office, they were directed there. The patient verbally consented to this visit. Location of the patient: home Location of the provider: work Those involved with this call:  Provider: Aura Dials, DNP CMA: Tristan Schroeder, CMA Front Desk/Registration: Kandice Hams  Time spent on call:  21 minutes with patient face to face via video conference. More than 50% of this time was spent in counseling and coordination of care. 15 minutes total spent in review of patient's record and preparation of their chart.  I verified patient identity using two factors (patient name and date of birth). Patient consents verbally to being seen via telemedicine visit today.    ADHD FOLLOW UP Has been off medications for 3 months, took Vyvanse.  Life feels normal, work feels a little lazy, but feels normal. Overall he states he is doing well without medication on board. ADHD status: stable Satisfied with current therapy: yes Controlled substance contract: yes Previous psychiatry evaluation: no Previous medications: Vyvanse   Taking meds on weekends/vacations: no Work/school performance:  good Difficulty sustaining attention/completing tasks: no Distracted by extraneous stimuli: no Does not listen when spoken to: no  Fidgets with hands or feet: no Unable to stay in seat: no Blurts out/interrupts others: no ADHD Medication Side  Effects: no    Decreased appetite: no    Headache: no    Sleeping disturbance pattern: no    Irritability: no    Rebound effects (worse than baseline) off medication: no    Anxiousness: no    Dizziness: no    Tics: no   Relevant past medical, surgical, family and social history reviewed and updated as indicated. Interim medical history since our last visit reviewed. Allergies and medications reviewed and updated.  Review of Systems  Constitutional:  Negative for activity change, diaphoresis, fatigue and fever.  Respiratory:  Negative for cough, chest tightness, shortness of breath and wheezing.   Cardiovascular:  Negative for chest pain, palpitations and leg swelling.  Neurological: Negative.   Psychiatric/Behavioral: Negative.     Per HPI unless specifically indicated above     Objective:    There were no vitals taken for this visit.  Wt Readings from Last 3 Encounters:  08/30/22 169 lb 4.8 oz (76.8 kg)  08/10/22 160 lb (72.6 kg)  02/01/22 171 lb 12.8 oz (77.9 kg)    Physical Exam Vitals and nursing note reviewed.  Constitutional:      General: He is awake. He is not in acute distress.    Appearance: He is well-developed. He is not ill-appearing.  HENT:     Head: Normocephalic.     Right Ear: Hearing normal. No drainage.     Left Ear: Hearing normal. No drainage.  Eyes:     General: Lids are normal.        Right eye: No discharge.  Left eye: No discharge.     Conjunctiva/sclera: Conjunctivae normal.  Pulmonary:     Effort: Pulmonary effort is normal. No accessory muscle usage or respiratory distress.  Musculoskeletal:     Cervical back: Normal range of motion.  Neurological:     Mental Status: He is alert and oriented to person, place, and time.  Psychiatric:        Mood and Affect: Mood normal.        Behavior: Behavior normal. Behavior is cooperative.        Thought Content: Thought content normal.        Judgment: Judgment normal.    Results for  orders placed or performed in visit on 02/01/22  [id number] 11+Oxyco+Alc+Crt-Bund  Result Value Ref Range   Ethanol Negative Cutoff=0.020 %   Amphetamines, Urine Negative Cutoff=1000 ng/mL   Barbiturate Negative Cutoff=200 ng/mL   BENZODIAZ UR QL Negative Cutoff=200 ng/mL   Cannabinoid Quant, Ur Negative Cutoff=50 ng/mL   Cocaine (Metabolite) Negative Cutoff=300 ng/mL   OPIATE SCREEN URINE Negative Cutoff=300 ng/mL   Oxycodone/Oxymorphone, Urine Negative Cutoff=300 ng/mL   Phencyclidine Negative Cutoff=25 ng/mL   Methadone Screen, Urine Negative Cutoff=300 ng/mL   Propoxyphene Negative Cutoff=300 ng/mL   Meperidine Negative Cutoff=200 ng/mL   Tramadol Negative Cutoff=200 ng/mL   Creatinine 123.9 20.0 - 300.0 mg/dL   pH, Urine 6.3 4.5 - 8.9      Assessment & Plan:   Problem List Items Addressed This Visit       Other   ADHD - Primary    Overall stable at this time without medication on board.  Took Vyvanse in past.  He wishes to continue without medication.  If he wishes to restart in future we will schedule a visit and update UDS.  To follow-up as needed if increased symptoms or wishes to restart medication.       I discussed the assessment and treatment plan with the patient. The patient was provided an opportunity to ask questions and all were answered. The patient agreed with the plan and demonstrated an understanding of the instructions.   The patient was advised to call back or seek an in-person evaluation if the symptoms worsen or if the condition fails to improve as anticipated.   I provided 21+ minutes of time during this encounter.    Follow up plan: Return if symptoms worsen or fail to improve.

## 2023-02-19 NOTE — Assessment & Plan Note (Signed)
Overall stable at this time without medication on board.  Took Vyvanse in past.  He wishes to continue without medication.  If he wishes to restart in future we will schedule a visit and update UDS.  To follow-up as needed if increased symptoms or wishes to restart medication.

## 2023-08-18 ENCOUNTER — Emergency Department
Admission: EM | Admit: 2023-08-18 | Discharge: 2023-08-18 | Disposition: A | Discharge status: Home / Self Care | Payer: Medicaid Other | Attending: Emergency Medicine | Admitting: Emergency Medicine

## 2023-08-18 ENCOUNTER — Other Ambulatory Visit: Payer: Self-pay

## 2023-08-18 DIAGNOSIS — K529 Noninfective gastroenteritis and colitis, unspecified: Secondary | ICD-10-CM | POA: Diagnosis not present

## 2023-08-18 DIAGNOSIS — R112 Nausea with vomiting, unspecified: Secondary | ICD-10-CM | POA: Diagnosis present

## 2023-08-18 DIAGNOSIS — Z20822 Contact with and (suspected) exposure to covid-19: Secondary | ICD-10-CM | POA: Diagnosis not present

## 2023-08-18 LAB — URINALYSIS, ROUTINE W REFLEX MICROSCOPIC
Bilirubin Urine: NEGATIVE
Glucose, UA: NEGATIVE mg/dL
Hgb urine dipstick: NEGATIVE
Ketones, ur: NEGATIVE mg/dL
Leukocytes,Ua: NEGATIVE
Nitrite: NEGATIVE
Protein, ur: NEGATIVE mg/dL
Specific Gravity, Urine: 1.027 (ref 1.005–1.030)
pH: 5 (ref 5.0–8.0)

## 2023-08-18 LAB — COMPREHENSIVE METABOLIC PANEL
ALT: 37 U/L (ref 0–44)
AST: 29 U/L (ref 15–41)
Albumin: 4.7 g/dL (ref 3.5–5.0)
Alkaline Phosphatase: 53 U/L (ref 38–126)
Anion gap: 10 (ref 5–15)
BUN: 21 mg/dL — ABNORMAL HIGH (ref 6–20)
CO2: 23 mmol/L (ref 22–32)
Calcium: 8.8 mg/dL — ABNORMAL LOW (ref 8.9–10.3)
Chloride: 103 mmol/L (ref 98–111)
Creatinine, Ser: 0.82 mg/dL (ref 0.61–1.24)
GFR, Estimated: >60 mL/min (ref >60)
Glucose, Bld: 98 mg/dL (ref 70–99)
Potassium: 4.4 mmol/L (ref 3.5–5.1)
Sodium: 136 mmol/L (ref 135–145)
Total Bilirubin: 1 mg/dL (ref 0.0–1.2)
Total Protein: 7.6 g/dL (ref 6.5–8.1)

## 2023-08-18 LAB — LIPASE, BLOOD: Lipase: 36 U/L (ref 11–51)

## 2023-08-18 LAB — RESP PANEL BY RT-PCR (RSV, FLU A&B, COVID)  RVPGX2
Influenza A by PCR: NEGATIVE
Influenza B by PCR: NEGATIVE
Resp Syncytial Virus by PCR: NEGATIVE
SARS Coronavirus 2 by RT PCR: NEGATIVE

## 2023-08-18 LAB — CBC
HCT: 47.4 % (ref 39.0–52.0)
Hemoglobin: 16.2 g/dL (ref 13.0–17.0)
MCH: 30.2 pg (ref 26.0–34.0)
MCHC: 34.2 g/dL (ref 30.0–36.0)
MCV: 88.4 fL (ref 80.0–100.0)
Platelets: 158 10*3/uL (ref 150–400)
RBC: 5.36 MIL/uL (ref 4.22–5.81)
RDW: 12.5 % (ref 11.5–15.5)
WBC: 8.7 10*3/uL (ref 4.0–10.5)
nRBC: 0 % (ref 0.0–0.2)

## 2023-08-18 MED ORDER — ONDANSETRON 4 MG PO TBDP: 4.0000 mg | ORAL_TABLET | Freq: Three times a day (TID) | ORAL | 0 refills | Status: AC | PRN

## 2023-08-18 MED ORDER — ONDANSETRON 4 MG PO TBDP
4.0000 mg | ORAL_TABLET | Freq: Once | ORAL | Status: AC | PRN
  Administered 2023-08-18: 4 mg via ORAL
  Filled 2023-08-18: qty 1

## 2023-08-18 NOTE — ED Provider Notes (Signed)
   [facility] Provider Note    Event Date/Time   First MD Initiated Contact with Patient 08/18/23 [phone]     (approximate)  History   Chief Complaint: Emesis  HPI  Robert Lane is a 23 y.o. male who presents to the emergency department for nausea vomiting and diarrhea.  According to the patient his girlfriend has been sick over the last 2 days or so with nausea vomiting diarrhea starting this morning he began experiencing nausea vomiting and diarrhea as well.  Denies any fever.  No abdominal pain.  Physical Exam   Triage Vital Signs: ED Triage Vitals  Encounter Vitals Group     BP 08/18/23 0527 120/72     Systolic BP Percentile --      Diastolic BP Percentile --      Pulse Rate 08/18/23 0527 100     Resp 08/18/23 0527 16     Temp 08/18/23 0527 98.3 F (36.8 C)     Temp Source 08/18/23 0527 Oral     SpO2 08/18/23 0527 98 %     Weight 08/18/23 0528 170 lb (77.1 kg)     Height 08/18/23 0528 5\' 10"  (1.778 m)     Head Circumference --      Peak Flow --      Pain Score 08/18/23 0528 0     Pain Loc --      Pain Education --      Exclude from Growth Chart --     Most recent vital signs: Vitals:   08/18/23 0527  BP: 120/72  Pulse: 100  Resp: 16  Temp: 98.3 F (36.8 C)  SpO2: 98%    General: Awake, no distress.  CV:  Good peripheral perfusion. Resp:  Normal effort.   Abd:  No distention.  ED Results / Procedures / Treatments   MEDICATIONS ORDERED IN ED: Medications  ondansetron (ZOFRAN-ODT) disintegrating tablet 4 mg (4 mg Oral Given 08/18/23 0533)     IMPRESSION / MDM / ASSESSMENT AND PLAN / ED COURSE  I reviewed the triage vital signs and the nursing notes.  Patient's presentation is most consistent with acute illness / injury with system symptoms.  Patient presents the emergency department for nausea vomiting diarrhea starting this morning.  Girlfriend has been sick with the same symptoms for the last 2 to 3 days.  Reassuring vital  sign, patient appears well.  Lab work is reassuring with a normal CBC chemistry lipase and urinalysis.  Discussed with the patient likely gastroenteritis possible norovirus.  Discussed supportive care including plenty fluids rest we will prescribe Zofran to be used as needed for nausea.  Discussed waiting 24 hours before stopping diarrhea with Imodium.  Patient agreeable to plan of care.  FINAL CLINICAL IMPRESSION(S) / ED DIAGNOSES   Gastroenteritis  Rx / DC Orders   Zofran  Note:  This document was prepared using Dragon voice recognition software and may include unintentional dictation errors.   Minna Antis, MD 08/18/23 [phone]

## 2023-08-18 NOTE — ED Triage Notes (Signed)
Pt reports nausea, vomiting, and diarrhea x 1 hour. Pt's girlfriend has also been sick with same symptoms. Denies abdominal pain.
